# Patient Record
Sex: Female | Born: 1985
Health system: Southern US, Community
[De-identification: ages and names within clinical notes are randomized; demographics above are authoritative.]

## PROBLEM LIST (undated history)

## (undated) DIAGNOSIS — R519 Headache, unspecified: Secondary | ICD-10-CM

## (undated) DIAGNOSIS — R51 Headache: Secondary | ICD-10-CM

## (undated) DIAGNOSIS — N83209 Unspecified ovarian cyst, unspecified side: Secondary | ICD-10-CM

## (undated) DIAGNOSIS — R7303 Prediabetes: Secondary | ICD-10-CM

## (undated) DIAGNOSIS — F419 Anxiety disorder, unspecified: Secondary | ICD-10-CM

## (undated) HISTORY — DX: Prediabetes: R73.03

## (undated) HISTORY — DX: Anxiety disorder, unspecified: F41.9

## (undated) HISTORY — PX: WISDOM TOOTH EXTRACTION: SHX21

## (undated) HISTORY — PX: BREAST BIOPSY: SHX20

## (undated) HISTORY — DX: Unspecified ovarian cyst, unspecified side: N83.209

---

## 2006-04-12 ENCOUNTER — Emergency Department (HOSPITAL_COMMUNITY): Admission: EM | Admit: 2006-04-12 | Discharge: 2006-04-12 | Payer: Self-pay | Admitting: Emergency Medicine

## 2015-10-05 DIAGNOSIS — Z01419 Encounter for gynecological examination (general) (routine) without abnormal findings: Secondary | ICD-10-CM | POA: Diagnosis not present

## 2015-10-05 DIAGNOSIS — Z681 Body mass index (BMI) 19 or less, adult: Secondary | ICD-10-CM | POA: Diagnosis not present

## 2015-10-05 DIAGNOSIS — Z124 Encounter for screening for malignant neoplasm of cervix: Secondary | ICD-10-CM | POA: Diagnosis not present

## 2015-11-04 DIAGNOSIS — S0922XA Traumatic rupture of left ear drum, initial encounter: Secondary | ICD-10-CM | POA: Diagnosis not present

## 2015-12-06 DIAGNOSIS — Z0000 Encounter for general adult medical examination without abnormal findings: Secondary | ICD-10-CM | POA: Diagnosis not present

## 2015-12-08 DIAGNOSIS — Z681 Body mass index (BMI) 19 or less, adult: Secondary | ICD-10-CM | POA: Diagnosis not present

## 2015-12-08 DIAGNOSIS — Z0001 Encounter for general adult medical examination with abnormal findings: Secondary | ICD-10-CM | POA: Diagnosis not present

## 2015-12-08 DIAGNOSIS — D649 Anemia, unspecified: Secondary | ICD-10-CM | POA: Diagnosis not present

## 2016-07-25 DIAGNOSIS — L819 Disorder of pigmentation, unspecified: Secondary | ICD-10-CM | POA: Diagnosis not present

## 2016-07-25 DIAGNOSIS — D225 Melanocytic nevi of trunk: Secondary | ICD-10-CM | POA: Diagnosis not present

## 2016-07-25 DIAGNOSIS — D1801 Hemangioma of skin and subcutaneous tissue: Secondary | ICD-10-CM | POA: Diagnosis not present

## 2016-07-25 DIAGNOSIS — L700 Acne vulgaris: Secondary | ICD-10-CM | POA: Diagnosis not present

## 2016-09-02 DIAGNOSIS — Z6820 Body mass index (BMI) 20.0-20.9, adult: Secondary | ICD-10-CM | POA: Diagnosis not present

## 2016-09-02 DIAGNOSIS — J302 Other seasonal allergic rhinitis: Secondary | ICD-10-CM | POA: Diagnosis not present

## 2016-09-02 DIAGNOSIS — F419 Anxiety disorder, unspecified: Secondary | ICD-10-CM | POA: Diagnosis not present

## 2016-10-31 DIAGNOSIS — Z124 Encounter for screening for malignant neoplasm of cervix: Secondary | ICD-10-CM | POA: Diagnosis not present

## 2016-10-31 DIAGNOSIS — Z01419 Encounter for gynecological examination (general) (routine) without abnormal findings: Secondary | ICD-10-CM | POA: Diagnosis not present

## 2016-11-02 ENCOUNTER — Emergency Department (HOSPITAL_COMMUNITY): Payer: 59

## 2016-11-02 ENCOUNTER — Encounter (HOSPITAL_COMMUNITY): Payer: Self-pay | Admitting: Emergency Medicine

## 2016-11-02 ENCOUNTER — Emergency Department (HOSPITAL_COMMUNITY)
Admission: EM | Admit: 2016-11-02 | Discharge: 2016-11-02 | Disposition: A | Discharge status: Home / Self Care | Payer: 59 | Attending: Emergency Medicine | Admitting: Emergency Medicine

## 2016-11-02 DIAGNOSIS — R110 Nausea: Secondary | ICD-10-CM | POA: Diagnosis not present

## 2016-11-02 DIAGNOSIS — M549 Dorsalgia, unspecified: Secondary | ICD-10-CM | POA: Diagnosis not present

## 2016-11-02 DIAGNOSIS — N83201 Unspecified ovarian cyst, right side: Secondary | ICD-10-CM | POA: Diagnosis not present

## 2016-11-02 DIAGNOSIS — R1031 Right lower quadrant pain: Secondary | ICD-10-CM

## 2016-11-02 DIAGNOSIS — R109 Unspecified abdominal pain: Secondary | ICD-10-CM | POA: Diagnosis not present

## 2016-11-02 LAB — URINALYSIS, ROUTINE W REFLEX MICROSCOPIC
Bilirubin Urine: NEGATIVE
GLUCOSE, UA: NEGATIVE mg/dL
HGB URINE DIPSTICK: NEGATIVE
KETONES UR: 20 mg/dL — AB
Leukocytes, UA: NEGATIVE
Nitrite: NEGATIVE
PH: 6 (ref 5.0–8.0)
Protein, ur: 30 mg/dL — AB
Specific Gravity, Urine: 1.025 (ref 1.005–1.030)

## 2016-11-02 LAB — CBC
HCT: 39 % (ref 36.0–46.0)
Hemoglobin: 13.4 g/dL (ref 12.0–15.0)
MCH: 31.2 pg (ref 26.0–34.0)
MCHC: 34.4 g/dL (ref 30.0–36.0)
MCV: 90.9 fL (ref 78.0–100.0)
PLATELETS: 283 10*3/uL (ref 150–400)
RBC: 4.29 MIL/uL (ref 3.87–5.11)
RDW: 11.8 % (ref 11.5–15.5)
WBC: 5.3 10*3/uL (ref 4.0–10.5)

## 2016-11-02 LAB — COMPREHENSIVE METABOLIC PANEL
ALK PHOS: 32 U/L — AB (ref 38–126)
ALT: 13 U/L — AB (ref 14–54)
AST: 20 U/L (ref 15–41)
Albumin: 4.1 g/dL (ref 3.5–5.0)
Anion gap: 7 (ref 5–15)
BUN: 14 mg/dL (ref 6–20)
CALCIUM: 9.1 mg/dL (ref 8.9–10.3)
CO2: 27 mmol/L (ref 22–32)
CREATININE: 0.77 mg/dL (ref 0.44–1.00)
Chloride: 102 mmol/L (ref 101–111)
GFR calc non Af Amer: >60 mL/min (ref >60)
GLUCOSE: 97 mg/dL (ref 65–99)
Potassium: 3.8 mmol/L (ref 3.5–5.1)
SODIUM: 136 mmol/L (ref 135–145)
Total Bilirubin: 0.8 mg/dL (ref 0.3–1.2)
Total Protein: 6.8 g/dL (ref 6.5–8.1)

## 2016-11-02 LAB — LIPASE, BLOOD: Lipase: 27 U/L (ref 11–51)

## 2016-11-02 LAB — I-STAT BETA HCG BLOOD, ED (MC, WL, AP ONLY): I-stat hCG, quantitative: <5 m[IU]/mL (ref <5)

## 2016-11-02 MED ORDER — NAPROXEN 500 MG PO TABS: 500.0000 mg | ORAL_TABLET | Freq: Two times a day (BID) | ORAL | 0 refills | Status: DC | PRN

## 2016-11-02 MED ORDER — SODIUM CHLORIDE 0.9 % IV BOLUS (SEPSIS)
1000.0000 mL | Freq: Once | INTRAVENOUS | Status: AC
  Administered 2016-11-02: 1000 mL via INTRAVENOUS

## 2016-11-02 MED ORDER — MORPHINE SULFATE (PF) 4 MG/ML IV SOLN
4.0000 mg | Freq: Once | INTRAVENOUS | Status: AC
  Administered 2016-11-02: 4 mg via INTRAVENOUS
  Filled 2016-11-02: qty 1

## 2016-11-02 MED ORDER — HYDROCODONE-ACETAMINOPHEN 5-325 MG PO TABS: 1.0000 | ORAL_TABLET | Freq: Four times a day (QID) | ORAL | 0 refills | Status: DC | PRN

## 2016-11-02 MED ORDER — KETOROLAC TROMETHAMINE 30 MG/ML IJ SOLN
30.0000 mg | Freq: Once | INTRAMUSCULAR | Status: AC
  Administered 2016-11-02: 30 mg via INTRAVENOUS
  Filled 2016-11-02: qty 1

## 2016-11-02 MED ORDER — ONDANSETRON HCL 4 MG/2ML IJ SOLN
4.0000 mg | Freq: Once | INTRAMUSCULAR | Status: AC
  Administered 2016-11-02: 4 mg via INTRAVENOUS
  Filled 2016-11-02: qty 2

## 2016-11-02 MED ORDER — ONDANSETRON 4 MG PO TBDP: 4.0000 mg | ORAL_TABLET | Freq: Three times a day (TID) | ORAL | 0 refills | Status: DC | PRN

## 2016-11-02 NOTE — ED Triage Notes (Addendum)
Pt c/o onset Wednesday with right low back pain that increases with movement and after urination. Pt reports nausea with dry heaves onset today. Pt seen at OB/GYN Thursday and had urinalysis done but have not heard back about the results.

## 2016-11-02 NOTE — Discharge Instructions (Signed)
Your symptoms are due to two ovarian cysts. Alternate between naprosyn and norco as directed as needed for pain control. Do not drive or operate machinery while taking norco. Use heat to the areas of pain. You may get constipated from the norco, use over the counter stool softeners to help with this, and stay well hydrated with lots of water. Use zofran as directed as needed for nausea. Follow up with your OBGYN in the next 5-7 days for recheck of symptoms and ongoing management of this issue. If symptoms sudden change/worsen, then [facility] (similar to their version of an ER) for emergent evaluation.

## 2016-11-02 NOTE — ED Provider Notes (Signed)
[facility] EMERGENCY DEPARTMENT Provider Note   CSN: [id number] Arrival date & time: 11/02/16  3570     History   Chief Complaint Chief Complaint  Patient presents with  . Back Pain  . Nausea    HPI Kathleen Wood is a 31 y.o. Otherwise healthy female who works in the [employer] as a Marine scientist, who presents to the ED with complaints of gradual onset gradually worsening RLQ pain that radiates to the right flank that began 3 days ago. She describes the pain is 9/10 intermittent dull but occasionally sharp RLQ pain that radiates into the right flank area, worse with movement and urination, and unrelieved with Advil. She reports associated nausea. She states that she went her OB/GYN Dr. Harrington Challenger at [facility] on Thursday, the day after onset of symptoms, but at that time her pain wasn't as severe. They performed a pelvic exam in order to do an annual Pap smear, and patient reports that the pelvic exam did not really aggravate any of her pain. She had a UA done however she was never given any of the results therefore she assumes it was negative. She doesn't think a wet prep was performed, but she denies any vaginal discharge. LMP was 10/19/16. No history of kidney stones.  She denies fevers, chills, CP, SOB, vomiting, diarrhea/constipation, obstipation, melena, hematochezia, hematuria, dysuria, urine frequency/urgency, vaginal bleeding/discharge, myalgias, arthralgias, numbness, tingling, focal weakness, or any other complaints at this time. Denies recent travel, sick contacts, suspicious food intake, EtOH use, frequent NSAID use, or prior abd surgeries.    The history is provided by the patient and medical records. No language interpreter was used.  Abdominal Pain   This is a new problem. The current episode started more than 2 days ago. The problem occurs daily. The problem has been gradually worsening. The pain is associated with an unknown factor. The pain is located in the RLQ.  The quality of the pain is dull and sharp. The pain is at a severity of 9/10. The pain is moderate. Associated symptoms include nausea. Pertinent negatives include fever, diarrhea, flatus, hematochezia, melena, vomiting, constipation, dysuria, frequency, hematuria, arthralgias and myalgias. The symptoms are aggravated by activity and urination. Nothing relieves the symptoms.    History reviewed. No pertinent past medical history.  There are no active problems to display for this patient.   Past Surgical History:  Procedure Laterality Date  . WISDOM TOOTH EXTRACTION      OB History    No data available       Home Medications    Prior to Admission medications   Not on File    Family History No family history on file.  Social History Social History  Substance Use Topics  . Smoking status: Never Smoker  . Smokeless tobacco: Never Used  . Alcohol use Yes     Allergies   Patient has no known allergies.   Review of Systems Review of Systems  Constitutional: Negative for chills and fever.  Respiratory: Negative for shortness of breath.   Cardiovascular: Negative for chest pain.  Gastrointestinal: Positive for abdominal pain and nausea. Negative for blood in stool, constipation, diarrhea, flatus, hematochezia, melena and vomiting.  Genitourinary: Positive for flank pain. Negative for dysuria, frequency, hematuria, urgency, vaginal bleeding and vaginal discharge.  Musculoskeletal: Positive for back pain. Negative for arthralgias and myalgias.  Skin: Negative for color change.  Allergic/Immunologic: Negative for immunocompromised state.  Neurological: Negative for weakness and numbness.  Psychiatric/Behavioral: Negative for  confusion.   All other systems reviewed and are negative for acute change except as noted in the HPI.    Physical Exam Updated Vital Signs BP 117/83   Pulse (!) 105   Temp 98.4 F (36.9 C) (Oral)   Ht 5' (1.524 m)   Wt 49.9 kg (110 lb)   LMP  10/19/2016   SpO2 100%   BMI 21.48 kg/m   Physical Exam  Constitutional: She is oriented to person, place, and time. Vital signs are normal. She appears well-developed and well-nourished.  Non-toxic appearance. No distress.  Afebrile, nontoxic, appears uncomfortable but in NAD  HENT:  Head: Normocephalic and atraumatic.  Mouth/Throat: Oropharynx is clear and moist and mucous membranes are normal.  Eyes: Conjunctivae and EOM are normal. Right eye exhibits no discharge. Left eye exhibits no discharge.  Neck: Normal range of motion. Neck supple.  Cardiovascular: Regular rhythm, normal heart sounds and intact distal pulses.  Tachycardia present.  Exam reveals no gallop and no friction rub.   No murmur heard. Slightly tachycardic in low 100s, likely pain related  Pulmonary/Chest: Effort normal and breath sounds normal. No respiratory distress. She has no decreased breath sounds. She has no wheezes. She has no rhonchi. She has no rales.  Abdominal: Soft. Normal appearance and bowel sounds are normal. She exhibits no distension. There is tenderness in the right lower quadrant. There is CVA tenderness. There is no rigidity, no rebound, no guarding, no tenderness at McBurney's point and negative Murphy's sign.    Soft, nondistended, +BS throughout, with mild RLQ TTP tracking towards the R flank area, no r/g/r, neg murphy's, neg mcburney's point tenderness, +R sided CVA TTP   Musculoskeletal: Normal range of motion.  Neurological: She is alert and oriented to person, place, and time. She has normal strength. No sensory deficit.  Skin: Skin is warm, dry and intact. No rash noted.  Psychiatric: She has a normal mood and affect.  Nursing note and vitals reviewed.    ED Treatments / Results  Labs (all labs ordered are listed, but only abnormal results are displayed) Labs Reviewed  COMPREHENSIVE METABOLIC PANEL - Abnormal; Notable for the following:       Result Value   ALT 13 (*)    Alkaline  Phosphatase 32 (*)    All other components within normal limits  URINALYSIS, ROUTINE W REFLEX MICROSCOPIC - Abnormal; Notable for the following:    Ketones, ur 20 (*)    Protein, ur 30 (*)    Bacteria, UA RARE (*)    Squamous Epithelial / LPF 0-5 (*)    All other components within normal limits  LIPASE, BLOOD  CBC  I-STAT BETA HCG BLOOD, ED ([facility], [facility], [facility] ONLY)    EKG  EKG Interpretation None       Radiology US Pelvis Transvanginal Non-ob (tv Only)  Result Date: 11/02/2016 CLINICAL DATA:  Right lower quadrant tenderness for 3 days. Abnormal CT. EXAM: TRANSABDOMINAL AND TRANSVAGINAL ULTRASOUND OF PELVIS DOPPLER ULTRASOUND OF OVARIES TECHNIQUE: Both transabdominal and transvaginal ultrasound examinations of the pelvis were performed. Transabdominal technique was performed for global imaging of the pelvis including uterus, ovaries, adnexal regions, and pelvic cul-de-sac. It was necessary to proceed with endovaginal exam following the transabdominal exam to visualize the adnexal structures to an adequate degree. Color and duplex Doppler ultrasound was utilized to evaluate blood flow to the ovaries. COMPARISON:  CT from earlier same day. FINDINGS: Uterus Measurements: 6.7 x 3.6 x 5.2 cm. No fibroids or other mass  visualized. Endometrium Thickness: 9 mm. Subtle echogenic area within the fundal region of the endometrial complex measures 8 x 4 x 8 mm, of uncertain etiology or significance. No fluid within the underlying endometrial canal. Right ovary Measurements: 7.4 x 4.3 x 6.3 cm. Large right ovarian cyst measuring 6.3 x 3.9 cm, avascular. Additional moderate-sized right ovarian cyst measures 3 x 2.2 cm, avascular. No suspicious-appearing mass within the right ovary. No mass or significant free fluid in the adjacent right adnexal region. Left ovary Measurements: 3.9 x 1.9 x 1.6 cm. Numerous small follicles within the left ovary. No mass or free fluid in the adjacent left adnexal region. Pulsed  Doppler evaluation of both ovaries demonstrates normal low-resistance arterial and venous waveforms. Other findings Trace free fluid in the cul-de-sac and right adnexa is likely physiologic in nature. IMPRESSION: 1. Findings on recent CT correspond to a large right ovarian cyst and an additional moderate-sized right ovarian cyst, largest cyst measuring 6.3 x 3.9 cm. Right ovary is otherwise unremarkable. No evidence of associated ovarian torsion at this time. Please note that an ovarian cyst of this size may not resolve on its own and does predispose the patient to ovarian torsion in the future. At minimum, recommend follow-up pelvic ultrasound in 10-12 weeks to ensure resolution of the largest cyst. Consider gynecologic consultation for further workup and/or follow-up considerations. 2. Trace free fluid in the right adnexa and cul-de-sac is likely physiologic in nature. 3. Left ovary is normal. 4. Subtle echogenic focus within the fundal region of the endometrial complex, measuring 8 mm, of uncertain etiology or significance, suspect benign etiology but endometrial polyp or neoplasia cannot be confidently excluded based on this single exam. Remainder of the endometrium appears normal. Recommend attention to this area on the follow-up pelvic ultrasound in 10-12 weeks. Alternatively could consider more definitive characterization with uterine MRI. Electronically Signed   By: Franki Cabot M.D.   On: 11/02/2016 15:21   US Pelvis (transabdominal Only)  Result Date: 11/02/2016 CLINICAL DATA:  Right lower quadrant tenderness for 3 days. Abnormal CT. EXAM: TRANSABDOMINAL AND TRANSVAGINAL ULTRASOUND OF PELVIS DOPPLER ULTRASOUND OF OVARIES TECHNIQUE: Both transabdominal and transvaginal ultrasound examinations of the pelvis were performed. Transabdominal technique was performed for global imaging of the pelvis including uterus, ovaries, adnexal regions, and pelvic cul-de-sac. It was necessary to proceed with  endovaginal exam following the transabdominal exam to visualize the adnexal structures to an adequate degree. Color and duplex Doppler ultrasound was utilized to evaluate blood flow to the ovaries. COMPARISON:  CT from earlier same day. FINDINGS: Uterus Measurements: 6.7 x 3.6 x 5.2 cm. No fibroids or other mass visualized. Endometrium Thickness: 9 mm. Subtle echogenic area within the fundal region of the endometrial complex measures 8 x 4 x 8 mm, of uncertain etiology or significance. No fluid within the underlying endometrial canal. Right ovary Measurements: 7.4 x 4.3 x 6.3 cm. Large right ovarian cyst measuring 6.3 x 3.9 cm, avascular. Additional moderate-sized right ovarian cyst measures 3 x 2.2 cm, avascular. No suspicious-appearing mass within the right ovary. No mass or significant free fluid in the adjacent right adnexal region. Left ovary Measurements: 3.9 x 1.9 x 1.6 cm. Numerous small follicles within the left ovary. No mass or free fluid in the adjacent left adnexal region. Pulsed Doppler evaluation of both ovaries demonstrates normal low-resistance arterial and venous waveforms. Other findings Trace free fluid in the cul-de-sac and right adnexa is likely physiologic in nature. IMPRESSION: 1. Findings on recent CT correspond to  a large right ovarian cyst and an additional moderate-sized right ovarian cyst, largest cyst measuring 6.3 x 3.9 cm. Right ovary is otherwise unremarkable. No evidence of associated ovarian torsion at this time. Please note that an ovarian cyst of this size may not resolve on its own and does predispose the patient to ovarian torsion in the future. At minimum, recommend follow-up pelvic ultrasound in 10-12 weeks to ensure resolution of the largest cyst. Consider gynecologic consultation for further workup and/or follow-up considerations. 2. Trace free fluid in the right adnexa and cul-de-sac is likely physiologic in nature. 3. Left ovary is normal. 4. Subtle echogenic focus within  the fundal region of the endometrial complex, measuring 8 mm, of uncertain etiology or significance, suspect benign etiology but endometrial polyp or neoplasia cannot be confidently excluded based on this single exam. Remainder of the endometrium appears normal. Recommend attention to this area on the follow-up pelvic ultrasound in 10-12 weeks. Alternatively could consider more definitive characterization with uterine MRI. Electronically Signed   By: Franki Cabot M.D.   On: 11/02/2016 15:21   US Pelvic Doppler (torsion R/o Or Mass Arterial Flow)  Result Date: 11/02/2016 CLINICAL DATA:  Right lower quadrant tenderness for 3 days. Abnormal CT. EXAM: TRANSABDOMINAL AND TRANSVAGINAL ULTRASOUND OF PELVIS DOPPLER ULTRASOUND OF OVARIES TECHNIQUE: Both transabdominal and transvaginal ultrasound examinations of the pelvis were performed. Transabdominal technique was performed for global imaging of the pelvis including uterus, ovaries, adnexal regions, and pelvic cul-de-sac. It was necessary to proceed with endovaginal exam following the transabdominal exam to visualize the adnexal structures to an adequate degree. Color and duplex Doppler ultrasound was utilized to evaluate blood flow to the ovaries. COMPARISON:  CT from earlier same day. FINDINGS: Uterus Measurements: 6.7 x 3.6 x 5.2 cm. No fibroids or other mass visualized. Endometrium Thickness: 9 mm. Subtle echogenic area within the fundal region of the endometrial complex measures 8 x 4 x 8 mm, of uncertain etiology or significance. No fluid within the underlying endometrial canal. Right ovary Measurements: 7.4 x 4.3 x 6.3 cm. Large right ovarian cyst measuring 6.3 x 3.9 cm, avascular. Additional moderate-sized right ovarian cyst measures 3 x 2.2 cm, avascular. No suspicious-appearing mass within the right ovary. No mass or significant free fluid in the adjacent right adnexal region. Left ovary Measurements: 3.9 x 1.9 x 1.6 cm. Numerous small follicles within the  left ovary. No mass or free fluid in the adjacent left adnexal region. Pulsed Doppler evaluation of both ovaries demonstrates normal low-resistance arterial and venous waveforms. Other findings Trace free fluid in the cul-de-sac and right adnexa is likely physiologic in nature. IMPRESSION: 1. Findings on recent CT correspond to a large right ovarian cyst and an additional moderate-sized right ovarian cyst, largest cyst measuring 6.3 x 3.9 cm. Right ovary is otherwise unremarkable. No evidence of associated ovarian torsion at this time. Please note that an ovarian cyst of this size may not resolve on its own and does predispose the patient to ovarian torsion in the future. At minimum, recommend follow-up pelvic ultrasound in 10-12 weeks to ensure resolution of the largest cyst. Consider gynecologic consultation for further workup and/or follow-up considerations. 2. Trace free fluid in the right adnexa and cul-de-sac is likely physiologic in nature. 3. Left ovary is normal. 4. Subtle echogenic focus within the fundal region of the endometrial complex, measuring 8 mm, of uncertain etiology or significance, suspect benign etiology but endometrial polyp or neoplasia cannot be confidently excluded based on this single exam. Remainder of  the endometrium appears normal. Recommend attention to this area on the follow-up pelvic ultrasound in 10-12 weeks. Alternatively could consider more definitive characterization with uterine MRI. Electronically Signed   By: Franki Cabot M.D.   On: 11/02/2016 15:21   Ct Renal Stone Study  Result Date: 11/02/2016 CLINICAL DATA:  Right flank pain. EXAM: CT ABDOMEN AND PELVIS WITHOUT CONTRAST TECHNIQUE: Multidetector CT imaging of the abdomen and pelvis was performed following the standard protocol without IV contrast. COMPARISON:  None. FINDINGS: Lower chest: No acute abnormality. Hepatobiliary: No focal liver abnormality is seen. No gallstones, gallbladder wall thickening, or biliary  dilatation. Pancreas: Unremarkable. No pancreatic ductal dilatation or surrounding inflammatory changes. Spleen: Normal in size without focal abnormality. Adrenals/Urinary Tract: Adrenal glands are unremarkable. Kidneys are normal, without renal calculi, focal lesion, or hydronephrosis. Bladder is unremarkable. Stomach/Bowel: Stomach is within normal limits. Appendix appears normal. No evidence of bowel wall thickening, distention, or inflammatory changes. Moderate colonic stool burden. Vascular/Lymphatic: No significant vascular findings are present. No enlarged abdominal or pelvic lymph nodes. Reproductive: There is a 7.6 x 4.6 cm fluid density lesion in the right adnexa that appears to contain a few thin internal septations. The uterus and left adnexa are unremarkable. Other: No free fluid or pneumoperitoneum. Musculoskeletal: No acute or significant osseous findings. IMPRESSION: 1. 7.6 x 4.6 cm fluid density lesion with a few thin internal septations in the right adnexa, possibly representing an ovarian cyst or hydrosalpinx. Recommend further evaluation with pelvic ultrasound. 2. No renal or ureteral calculi.  No hydronephrosis. Electronically Signed   By: Titus Dubin M.D.   On: 11/02/2016 12:38    Procedures Procedures (including critical care time)  Medications Ordered in ED Medications  ondansetron (ZOFRAN) injection 4 mg (4 mg Intravenous Given 11/02/16 1158)  sodium chloride 0.9 % bolus 1,000 mL (0 mLs Intravenous Stopped 11/02/16 1334)  ketorolac (TORADOL) 30 MG/ML injection 30 mg (30 mg Intravenous Given 11/02/16 1158)  morphine 4 MG/ML injection 4 mg (4 mg Intravenous Given 11/02/16 1329)     Initial Impression / Assessment and Plan / ED Course  I have reviewed the triage vital signs and the nursing notes.  Pertinent labs & imaging results that were available during my care of the patient were reviewed by me and considered in my medical decision making (see chart for details).       30 y.o. female here with RLQ/R flank pain x3 days with associated nausea, went to OBGYN 2 days ago and had pelvic done which didn't really reproduce the pain; U/A done then but no results given to pt; pain progressively worsening. On exam, appears slightly uncomfortable, mildly tachycardic in the low 100s, with RLQ TTP tracking towards the R flank, +R CVA TTP, nonperitoneal, neg murphy's and mcburney's. Work up thus far reveals: betaHCG neg, CBC WNL, CMP WNL, lipase WNL. U/A pending. Will get CT renal to see if this is a kidney stone, also evaluate appendix although doubt this; could also be ovarian in etiology, if CT negative/shows adnexal finding then may consider pelvic U/S. Will give fluids and zofran, pt requesting toradol instead of any narcotics at this time; will reassess shortly  1:07 PM U/A with a few ketones/protein but no evidence of UTI. CT renal showing 7.6 x 4.6cm R adnexal lesion likely ovarian cyst or hydrosalpinx, will proceed with pelvic U/S. Pt feeling just slightly better with regards to pain, nausea improved; finally willing to take something more for pain, will give morphine then reassess after U/S.  4:06 PM Pelvic U/S confirms large ovarian cyst (actually, one large one and one smaller one) which are likely contributing to her pain; no ovarian torsion, but obviously due to the size, torsion is a concern. Given lack of acute/sudden change in symptoms, doubt that it's intermittently torsing at this point, however very strict return precautions advised regarding s/sx of this. Also showed endometrial lesion, recommended f/up for that as well. Given the size of the ovarian cyst, I discussed case with Dr. Rosana Hoes of [facility], and she felt that since there was no acute surgical pathology going on, pt could reasonable f/up outpatient for management of this, of course with the strict return precautions being stressed. Pt feeling much better after morphine, nausea continues to be  resolved. Will d/c home with naprosyn and norco, advised staying hydrated, will also rx zofran. Discussed use of heat as well. Very strict return precautions discussed, women's MAU evaluation discussed if symptoms worsen. F/up with OBGYN in 5-7 days for recheck of symptoms and ongoing management of these cysts.  [facility] reviewed prior to dispensing controlled substance medications, and 2 year search was notable for: alprazolam 0.25mg  #15 tabs on 09/09/16, otherwise no other controlled substances found. Risks/benefits/alternatives and expectations discussed regarding controlled substances. Side effects of medications discussed. Informed consent obtained.  I explained the diagnosis and have given explicit precautions to return to the ER including for any other new or worsening symptoms. The patient understands and accepts the medical plan as it's been dictated and I have answered their questions. Discharge instructions concerning home care and prescriptions have been given. The patient is STABLE and is discharged to home in good condition.    Final Clinical Impressions(s) / ED Diagnoses   Final diagnoses:  RLQ abdominal pain  Acute right lower quadrant pain  Cyst of right ovary  Nausea    New Prescriptions New Prescriptions   HYDROCODONE-ACETAMINOPHEN (NORCO) 5-325 MG TABLET    Take 1-2 tablets by mouth every 6 (six) hours as needed for severe pain.   NAPROXEN (NAPROSYN) 500 MG TABLET    Take 1 tablet (500 mg total) by mouth 2 (two) times daily as needed for mild pain, moderate pain or headache (TAKE WITH MEALS.).   ONDANSETRON (ZOFRAN ODT) 4 MG DISINTEGRATING TABLET    Take 1 tablet (4 mg total) by mouth every 8 (eight) hours as needed for nausea or vomiting.     [address], [place], [place] 22/[birth year] 8921    Delora Fuel, MD 19/41/74 [phone]

## 2016-11-04 ENCOUNTER — Inpatient Hospital Stay (HOSPITAL_COMMUNITY): Payer: 59

## 2016-11-04 ENCOUNTER — Observation Stay (HOSPITAL_COMMUNITY)
Admission: AD | Admit: 2016-11-04 | Discharge: 2016-11-05 | Disposition: A | Discharge status: Home / Self Care | Payer: 59 | Source: Ambulatory Visit | Attending: Obstetrics | Admitting: Obstetrics

## 2016-11-04 ENCOUNTER — Encounter (HOSPITAL_COMMUNITY): Payer: Self-pay | Admitting: *Deleted

## 2016-11-04 DIAGNOSIS — R112 Nausea with vomiting, unspecified: Secondary | ICD-10-CM | POA: Insufficient documentation

## 2016-11-04 DIAGNOSIS — N83291 Other ovarian cyst, right side: Secondary | ICD-10-CM | POA: Diagnosis not present

## 2016-11-04 DIAGNOSIS — M545 Low back pain: Secondary | ICD-10-CM | POA: Insufficient documentation

## 2016-11-04 DIAGNOSIS — R6883 Chills (without fever): Secondary | ICD-10-CM | POA: Diagnosis not present

## 2016-11-04 DIAGNOSIS — R1031 Right lower quadrant pain: Secondary | ICD-10-CM | POA: Diagnosis present

## 2016-11-04 DIAGNOSIS — Z79899 Other long term (current) drug therapy: Secondary | ICD-10-CM | POA: Diagnosis not present

## 2016-11-04 DIAGNOSIS — R102 Pelvic and perineal pain: Secondary | ICD-10-CM | POA: Diagnosis not present

## 2016-11-04 DIAGNOSIS — N83201 Unspecified ovarian cyst, right side: Secondary | ICD-10-CM | POA: Diagnosis present

## 2016-11-04 DIAGNOSIS — N83209 Unspecified ovarian cyst, unspecified side: Secondary | ICD-10-CM

## 2016-11-04 LAB — CBC
HEMATOCRIT: 33.4 % — AB (ref 36.0–46.0)
HEMOGLOBIN: 12.1 g/dL (ref 12.0–15.0)
MCH: 32.7 pg (ref 26.0–34.0)
MCHC: 36.2 g/dL — AB (ref 30.0–36.0)
MCV: 90.3 fL (ref 78.0–100.0)
Platelets: 260 10*3/uL (ref 150–400)
RBC: 3.7 MIL/uL — ABNORMAL LOW (ref 3.87–5.11)
RDW: 11.9 % (ref 11.5–15.5)
WBC: 6 10*3/uL (ref 4.0–10.5)

## 2016-11-04 LAB — URINALYSIS, ROUTINE W REFLEX MICROSCOPIC
Bilirubin Urine: NEGATIVE
GLUCOSE, UA: NEGATIVE mg/dL
HGB URINE DIPSTICK: NEGATIVE
Ketones, ur: 20 mg/dL — AB
LEUKOCYTES UA: NEGATIVE
Nitrite: NEGATIVE
PH: 5 (ref 5.0–8.0)
PROTEIN: NEGATIVE mg/dL
Specific Gravity, Urine: 1.015 (ref 1.005–1.030)

## 2016-11-04 MED ORDER — HYDROMORPHONE HCL 1 MG/ML IJ SOLN: 0.2000 mg | INTRAMUSCULAR | Status: DC | PRN

## 2016-11-04 MED ORDER — SIMETHICONE 80 MG PO CHEW: 80.0000 mg | CHEWABLE_TABLET | Freq: Four times a day (QID) | ORAL | Status: DC | PRN

## 2016-11-04 MED ORDER — ONDANSETRON HCL 4 MG PO TABS: 4.0000 mg | ORAL_TABLET | Freq: Four times a day (QID) | ORAL | Status: DC | PRN

## 2016-11-04 MED ORDER — SENNA 8.6 MG PO TABS
1.0000 | ORAL_TABLET | Freq: Two times a day (BID) | ORAL | Status: DC
  Administered 2016-11-04 – 2016-11-05 (×2): 8.6 mg via ORAL
  Filled 2016-11-04 (×3): qty 1

## 2016-11-04 MED ORDER — IBUPROFEN 600 MG PO TABS
600.0000 mg | ORAL_TABLET | Freq: Four times a day (QID) | ORAL | Status: DC | PRN
  Administered 2016-11-04 – 2016-11-05 (×2): 600 mg via ORAL
  Filled 2016-11-04 (×2): qty 1

## 2016-11-04 MED ORDER — OXYCODONE-ACETAMINOPHEN 5-325 MG PO TABS
1.0000 | ORAL_TABLET | Freq: Once | ORAL | Status: AC
  Administered 2016-11-04: 1 via ORAL
  Filled 2016-11-04: qty 1

## 2016-11-04 MED ORDER — ONDANSETRON HCL 4 MG/2ML IJ SOLN: 4.0000 mg | Freq: Four times a day (QID) | INTRAMUSCULAR | Status: DC | PRN

## 2016-11-04 MED ORDER — ACETAMINOPHEN 325 MG PO TABS: 650.0000 mg | ORAL_TABLET | Freq: Four times a day (QID) | ORAL | Status: DC | PRN

## 2016-11-04 MED ORDER — OXYCODONE HCL 5 MG PO TABS
5.0000 mg | ORAL_TABLET | ORAL | Status: DC | PRN
  Administered 2016-11-04: 5 mg via ORAL
  Administered 2016-11-05 (×2): 10 mg via ORAL
  Filled 2016-11-04: qty 1
  Filled 2016-11-04 (×2): qty 2

## 2016-11-04 NOTE — MAU Note (Signed)
Went to [facility] on Sat.  Dx with 2 large ovarian cysts on rt side.  Dc's with pain meds.  Tried to get in to gyn doc today, was unable.  When the pain is bad, it is really bad. Unable to lay down because of the pain in her back.

## 2016-11-04 NOTE — H&P (Signed)
31 y.o. G0 presents to MAU with pelvic pain.  She reports a 4 day history of pelvic pain.  Was seen in the ED on Saturday for RLQ vs flank pain.  Had a CT that showed normal appendix and no stone, but right adnexal mass.  TVUS showed right ovary with two simple cysts, measuring 6.9 x 3.9 cm and 3 x 2 cm with trace free fluid.   Exam was benign per note and she was discharged home with naproxen and norco.  She reports an increase intermittent pain overnight despite PO pain meds.  Continued back and RLQ pain.  Does not radiate.  Has decreased appetite but no vomiting.  Denies fevers or vaginal bleeding.  UPT neg 2d ago.  Repeat US was performed that showed largest cyst stable in size (measuring 5.2 x 4.9 cm).  The smaller cyst is now a hemorrhagic cyst measuring 4 x 3 cm with moderate free fluid in the pelvis.  Repeat CBC with stable hemoglobin of 12 and wbc 6.0.  She denies feeling dizzy or lightheaded with ambulation. Right ovary demonstrated normal low-resistance arterial and venous waveforms.   Of note, patient drinking frappuccino on presentation with no nausea or vomiting.  Does report no BM since Thursday and feeling bloated. CT scan on Saturday noted moderate colonic stool burden.      History reviewed. No pertinent past medical history.  Past Surgical History:  Procedure Laterality Date  . WISDOM TOOTH EXTRACTION      OB History  No data available    Social History   Social History  . Marital status: Single    Spouse name: N/A  . Number of children: N/A  . Years of education: N/A   Occupational History  . Not on file.   Social History Main Topics  . Smoking status: Never Smoker  . Smokeless tobacco: Never Used  . Alcohol use Yes  . Drug use: No  . Sexual activity: Not on file   Other Topics Concern  . Not on file   Social History Narrative  . No narrative on file   Patient has no known allergies.    Vitals:   11/04/16 1719 11/04/16 1720  BP:  112/75  Pulse:  93   Resp:    Temp:    SpO2: 99% 100%     General:  Patient resting comfortably in bed Abdomen:  Normal BS in all 4 quadrants.  Abdomen is soft, not rigid.  Mild TTP in RLQ.  No rebound or guarding.   Pelvic: cervix normal, no CMT.  Uterus soft, anteverted, mobile.  No left adnexal tenderness.  Mild right adnexal tenderness and fullness Ex:  no edema    A/P   31 y.o.  G0 with right hemorrhagic cyst and pelvic pain Long discussion with patient and family regards images on [place]. We discussed that ovarian torsion is a clinical diagnosis and flow to ovary noted on [place] does not definitively rule out torsion.  However, my suspicion for torsion is low at this time given overall exam and patient's clinical appearance.   There is moderate free fluid on ultrasound tonight, a change from prior scan--could represent simple fluid from cyst vs blood from hemorrhagic cyst. Either could be the cause of the pelvic pain due to ongoing peritoneal irritation.  Repeat hemoglobin tonight is stable and patient has stable vital signs with normal pulse and no signs/symptoms of acute blood loss anemia.  I do not feel strongly that diagnostic laparoscopy is indicated  at this time and the patient and her family agree.  Given second visit to ED in 3 days, will admit for observation overnight for pain control, serial exams, repeat cbc in AM.   Will keep NPO.     [place]

## 2016-11-04 NOTE — MAU Note (Signed)
Pt presents to MAU with increased pain from ovarian cyst diagnosed on 11/02/2016 in [facility] ED.  Pt is today reporting chills and increased N&V.

## 2016-11-04 NOTE — MAU Provider Note (Signed)
Patient Kathleen Wood is a 31 y.o. non-pregnant female here with abdominal pain. She was recently seen in the ED and diagnosed with ovarian cysts and given pain medication. She was instructed to follow up with her ob-gyn today but was not able to get an appt today. She came here because she was worried.   See ED note from 11-02-2016 for complete history and work-up.  History     CSN: [id number]  Arrival date and time: 11/04/16 1642   None     Chief Complaint  Patient presents with  . Abdominal Pain  . Back Pain   HPI  OB History    No data available      History reviewed. No pertinent past medical history.  Past Surgical History:  Procedure Laterality Date  . WISDOM TOOTH EXTRACTION      History reviewed. No pertinent family history.  Social History  Substance Use Topics  . Smoking status: Never Smoker  . Smokeless tobacco: Never Used  . Alcohol use Yes    Allergies: No Known Allergies  Prescriptions Prior to Admission  Medication Sig Dispense Refill Last Dose  . Adapalene 0.3 % gel Apply 1 application topically daily.   11/01/2016 at Unknown time  . clindamycin (CLEOCIN T) 1 % lotion Apply 1 application topically daily as needed.   11/01/2016 at Unknown time  . HYDROcodone-acetaminophen (NORCO) 5-325 MG tablet Take 1-2 tablets by mouth every 6 (six) hours as needed for severe pain. 20 tablet 0   . levocetirizine (XYZAL) 5 MG tablet Take 5 mg by mouth every evening.   11/01/2016 at Unknown time  . naproxen (NAPROSYN) 500 MG tablet Take 1 tablet (500 mg total) by mouth 2 (two) times daily as needed for mild pain, moderate pain or headache (TAKE WITH MEALS.). 20 tablet 0   . ondansetron (ZOFRAN ODT) 4 MG disintegrating tablet Take 1 tablet (4 mg total) by mouth every 8 (eight) hours as needed for nausea or vomiting. 15 tablet 0     Review of Systems  HENT: Negative.   Respiratory: Negative.   Cardiovascular: Negative.   Gastrointestinal: Positive for abdominal  pain. Negative for constipation and diarrhea.  Genitourinary: Negative.   Musculoskeletal: Negative.   Neurological: Negative.    Physical Exam   Blood pressure (!) 111/59, pulse 87, temperature 98.9 F (37.2 C), temperature source Oral, resp. rate 18, weight 109 lb 12 oz (49.8 kg), last menstrual period 10/19/2016, SpO2 99 %.  Physical Exam  Constitutional: She is oriented to person, place, and time. She appears well-developed.  HENT:  Head: Normocephalic.  Neck: Normal range of motion.  Respiratory: Effort normal.  GI: Soft.  Genitourinary:  Genitourinary Comments: NEFG; no lesions on outer labia. No suprapubic tenderness; tenderness to deep palpation over right ovary. Right lower quadrant feels enlarged and is tender to the touch. Left lower quadrant is non-tender and non-enlarged.   Musculoskeletal: Normal range of motion.  Neurological: She is alert and oriented to person, place, and time.  Skin: Skin is warm and dry.  Psychiatric: She has a normal mood and affect.    MAU Course  Procedures  MDM -percocet for pain x 23 -CBC and ABO -[place] and dopplers  Assessment and Plan  2030 pm: Dr. Loletta Specter at bedside to evaluate patient and assume care.   Mervyn Skeeters Donasia Wimes CNM 11/04/2016, 6:18 PM

## 2016-11-05 ENCOUNTER — Encounter (HOSPITAL_COMMUNITY): Payer: Self-pay

## 2016-11-05 DIAGNOSIS — N83291 Other ovarian cyst, right side: Secondary | ICD-10-CM | POA: Diagnosis not present

## 2016-11-05 DIAGNOSIS — M545 Low back pain: Secondary | ICD-10-CM | POA: Diagnosis not present

## 2016-11-05 DIAGNOSIS — R112 Nausea with vomiting, unspecified: Secondary | ICD-10-CM | POA: Diagnosis not present

## 2016-11-05 DIAGNOSIS — R6883 Chills (without fever): Secondary | ICD-10-CM | POA: Diagnosis not present

## 2016-11-05 DIAGNOSIS — Z79899 Other long term (current) drug therapy: Secondary | ICD-10-CM | POA: Diagnosis not present

## 2016-11-05 LAB — CBC
HCT: 31 % — ABNORMAL LOW (ref 36.0–46.0)
HEMATOCRIT: 31.5 % — AB (ref 36.0–46.0)
HEMOGLOBIN: 11.4 g/dL — AB (ref 12.0–15.0)
Hemoglobin: 11.3 g/dL — ABNORMAL LOW (ref 12.0–15.0)
MCH: 32.6 pg (ref 26.0–34.0)
MCH: 32.8 pg (ref 26.0–34.0)
MCHC: 36.5 g/dL — ABNORMAL HIGH (ref 30.0–36.0)
MCHC: 36.2 g/dL — ABNORMAL HIGH (ref 30.0–36.0)
MCV: 89.3 fL (ref 78.0–100.0)
MCV: 90.5 fL (ref 78.0–100.0)
PLATELETS: 250 10*3/uL (ref 150–400)
Platelets: 242 10*3/uL (ref 150–400)
RBC: 3.47 MIL/uL — AB (ref 3.87–5.11)
RBC: 3.48 MIL/uL — AB (ref 3.87–5.11)
RDW: 12 % (ref 11.5–15.5)
RDW: 12.1 % (ref 11.5–15.5)
WBC: 6.7 10*3/uL (ref 4.0–10.5)
WBC: 6.1 10*3/uL (ref 4.0–10.5)

## 2016-11-05 LAB — ABO/RH: ABO/RH(D): A NEG

## 2016-11-05 MED ORDER — OXYCODONE HCL 5 MG PO TABS: 5.0000 mg | ORAL_TABLET | ORAL | 0 refills | Status: DC | PRN

## 2016-11-05 NOTE — Progress Notes (Signed)
In to see and examine patient--she is currently sleeping.   Per RN, has been resting comfortably with reports of minimal pain.  Last examined by RN about 30 minutes ago with no concerns. Will not wake pt at this time.  Plan: repeat cbc at 0300.

## 2016-11-05 NOTE — Progress Notes (Signed)
Patient discharged home with family. Discharge teaching, paperwork, home care, prescriptions, and follow-up appts reviewed. No questions at this time.

## 2016-11-05 NOTE — Progress Notes (Signed)
Patient seen and examined.  Reports some continued lower abdominal pain, but overall improvement and dulling of pain from yesterday.  Back pain has improved significantly.  No further episodes of nausea, no vomiting.  No dizziness w ambulation to BR.  BP (!) 98/59 (BP Location: Left Arm)   Pulse 68   Temp 98.3 F (36.8 C) (Oral)   Resp 15   Ht 5' (1.524 m)   Wt 49.4 kg (109 lb)   LMP 10/19/2016   SpO2 100%   BMI 21.29 kg/m   Repeat CBC hgb 11.3 from 12.1  NAD Abd:  Soft, no rebound, no guarding.  Mild TTP RLQ  31yo w RLQ pain and hemorrhagic cyst Pain improving Slight drop in hgb, but BPs at baseline w pulse in 60s-70s.  No evidence of active bleeding--suspect drop in hemoglobin from initial rupture Long discussion w patient regarding symptoms and plan of care.  Do not feel urgent surgery warranted at this time for relatively stable hemoglobin as I doubt any active bleeding will be identified.  Recommend continued expectant management with follow up of unruptured 5 cm simple cyst on [place] in 6-12 weeks.  In interim, will manage pain with PO pain meds.  Will ambulate this AM.  If asymptomatic with ambulation, will consider allowing patient to eat with discharge to home this AM.  Patient agrees to plan of care.  Can consider short interval f/u in office at end of week with Dr. Harrington Challenger

## 2016-11-05 NOTE — Discharge Summary (Signed)
  31 y.o. G0 presented to MAU with pelvic pain.  She had 4 day history of pelvic pain.  Was seen in the ED on Saturday for RLQ vs flank pain.  Had a CT that showed normal appendix and no stone, but right adnexal mass.  TVUS showed right ovary with two simple cysts, measuring 6.9 x 3.9 cm and 3 x 2 cm with trace free fluid.   Exam was benign per note and she was discharged home with naproxen and norco.  She reports an increase intermittent pain overnight despite PO pain meds.  Continued back and RLQ pain.  Does not radiate.  Has decreased appetite but no vomiting.  Denies fevers or vaginal bleeding.  UPT neg 2d ago.  Repeat US was performed that showed largest cyst stable in size (measuring 5.2 x 4.9 cm).  The smaller cyst is now a hemorrhagic cyst measuring 4 x 3 cm with moderate free fluid in the pelvis.  Repeat CBC with stable hemoglobin of 12 and wbc 6.0.  She denies feeling dizzy or lightheaded with ambulation. Right ovary demonstrated normal low-resistance arterial and venous waveforms.   On hospital day 2, pt's pain is well controlled with PO pain meds. Hb declined from 12.1 to 11.3 initially.  At time of this discharge summary repeat CBC pending.  If stable pt will be discharged home with instruction to f/u in office Thurs with Dr. Carlis Abbott.  Rx for oxycodone given to patient by Dr. Carlis Abbott.  Precautions for hemorrhage and torsion reviewed with patient.

## 2016-11-07 DIAGNOSIS — N83201 Unspecified ovarian cyst, right side: Secondary | ICD-10-CM | POA: Diagnosis not present

## 2016-11-08 ENCOUNTER — Other Ambulatory Visit: Payer: Self-pay | Admitting: *Deleted

## 2016-11-08 NOTE — Patient Outreach (Signed)
[facility]) Care Management  11/08/2016  SALLYANNE BIRKHEAD 09-23-1985 [id number]   Subjective: Telephone call to patient's home number, no answer, left HIPAA compliant voicemail message, and requested call back.   Objective:  Per KPN (Knowledge Performance Now, point of care tool) and chart review, patient hospitalized  11/04/16 -11/05/16 for ovarian cysts.  Had ED visit on 11/02/16 for ovarian cysts and abdominal pain.     Assessment: Received UMR Transition of care referral on 11/07/16.   Transition of care follow up pending patient contact.     Plan: RNCM will call patient for 2nd telephone outreach attempt, transition of care follow up, within 10 business days if no return call.     Mical Kicklighter H. Annia Friendly, BSN, [place] Management [facility] Telephonic CM Phone: [phone] Fax: [phone]

## 2016-11-11 ENCOUNTER — Other Ambulatory Visit: Payer: Self-pay | Admitting: *Deleted

## 2016-11-11 ENCOUNTER — Ambulatory Visit: Payer: Self-pay | Admitting: *Deleted

## 2016-11-11 NOTE — Patient Outreach (Signed)
[facility]) Care Management  11/11/2016  Kathleen Wood 16-May-1985 [id number]   Subjective: Telephone call to patient's home number, no answer, left HIPAA compliant voicemail message, and requested call back.   Objective:  Per KPN (Knowledge Performance Now, point of care tool) and chart review, patient hospitalized  11/04/16 -11/05/16 for ovarian cysts.  Had ED visit on 11/02/16 for ovarian cysts and abdominal pain.     Assessment: Received UMR Transition of care referral on 11/07/16.   Transition of care follow up pending patient contact.     Plan: RNCM will call patient for 3rd telephone outreach attempt, transition of care follow up, within 10 business days if no return call.     Teylor Wolven H. Annia Friendly, BSN, [place] Management [facility] Telephonic CM Phone: [phone] Fax: [phone]

## 2016-11-12 ENCOUNTER — Ambulatory Visit: Payer: Self-pay | Admitting: *Deleted

## 2016-11-12 ENCOUNTER — Encounter: Payer: Self-pay | Admitting: *Deleted

## 2016-11-12 ENCOUNTER — Other Ambulatory Visit: Payer: Self-pay | Admitting: *Deleted

## 2016-11-12 NOTE — Patient Outreach (Signed)
Kathleen Wood) Care Management  11/12/2016  Kathleen Wood January 24, 1985 [id number]   Subjective: Telephone call to patient's mobile number, no answer, left HIPAA compliant voicemail message, and requested call back.   Objective:Per KPN (Knowledge Performance Now, point of care tool) and chart review,patient hospitalized 11/04/16 -11/05/16 for ovarian cysts. Had ED visit on 11/02/16 for ovarian cysts and abdominal pain.     Assessment: Received UMR Transition of care referral on 11/07/16.Transition of care follow up pending patient contact.    Plan:RNCM will send unsuccessful outreach  letter, [facility] pamphlet, and proceed with case closure, within 10 business days if no return call.    Kathleen Wood, BSN, [place] Management [facility] Telephonic CM Phone: [phone] Fax: [phone]

## 2016-11-26 ENCOUNTER — Other Ambulatory Visit: Payer: Self-pay | Admitting: *Deleted

## 2016-11-26 NOTE — Patient Outreach (Signed)
[facility]) [place] Management  11/26/2016  Kathleen Wood November 14, 1985 [id number]   No response from patient outreach attempts will proceed with case closure.     Objective:Per KPN (Knowledge Performance Now, point of Wood tool) and chart review,patient hospitalized 11/04/16 -11/05/16 for ovarian cysts. Had ED visit on 11/02/16 for ovarian cysts and abdominal pain.     Assessment: Received UMR Transition of Wood referral on 11/07/16.Transition of Wood follow up not completed due to unable to contact patient and will proceed with case closure.    Plan:RNCM will send case closure due to unable to reach request to Kathleen Wood at [place] Management.     Kathleen Wood, BSN, [place] Management [facility] Telephonic CM Phone: [phone] Fax: [phone]

## 2017-01-10 MED FILL — LEVOCETIRIZINE 5 MG TABLET: 5 | 30 days supply | Qty: 30 | Fill #0

## 2017-01-16 DIAGNOSIS — J0110 Acute frontal sinusitis, unspecified: Secondary | ICD-10-CM | POA: Diagnosis not present

## 2017-01-16 DIAGNOSIS — Z6821 Body mass index (BMI) 21.0-21.9, adult: Secondary | ICD-10-CM | POA: Diagnosis not present

## 2017-01-16 MED FILL — AMOX-CLAV 875-125 MG TABLET: 875-125 | 10 days supply | Qty: 20 | Fill #0

## 2017-02-18 MED FILL — LEVOCETIRIZINE 5 MG TABLET: 5 | 90 days supply | Qty: 90 | Fill #1

## 2017-05-19 MED FILL — oxyCODONE HCL 5 MG TABS: 5 | 5 days supply | Qty: 20 | Fill #0

## 2017-05-19 MED FILL — LEVOCETIRIZINE 5 MG TABLET: 5 | 30 days supply | Qty: 30 | Fill #2

## 2017-06-06 ENCOUNTER — Other Ambulatory Visit: Payer: Self-pay

## 2017-06-06 ENCOUNTER — Encounter (HOSPITAL_COMMUNITY)
Admission: RE | Admit: 2017-06-06 | Discharge: 2017-06-06 | Disposition: A | Discharge status: Home / Self Care | Payer: No Typology Code available for payment source | Source: Ambulatory Visit | Attending: Obstetrics | Admitting: Obstetrics

## 2017-06-06 ENCOUNTER — Encounter (HOSPITAL_COMMUNITY): Payer: Self-pay

## 2017-06-06 DIAGNOSIS — Z01812 Encounter for preprocedural laboratory examination: Secondary | ICD-10-CM | POA: Insufficient documentation

## 2017-06-06 HISTORY — DX: Headache: R51

## 2017-06-06 HISTORY — DX: Headache, unspecified: R51.9

## 2017-06-06 LAB — CBC
HCT: 34.8 % — ABNORMAL LOW (ref 36.0–46.0)
HEMOGLOBIN: 11.7 g/dL — AB (ref 12.0–15.0)
MCH: 31.2 pg (ref 26.0–34.0)
MCHC: 33.6 g/dL (ref 30.0–36.0)
MCV: 92.8 fL (ref 78.0–100.0)
Platelets: 269 10*3/uL (ref 150–400)
RBC: 3.75 MIL/uL — ABNORMAL LOW (ref 3.87–5.11)
RDW: 12.2 % (ref 11.5–15.5)
WBC: 6 10*3/uL (ref 4.0–10.5)

## 2017-06-06 LAB — TYPE AND SCREEN
ABO/RH(D): A NEG
Antibody Screen: NEGATIVE

## 2017-06-06 NOTE — Patient Instructions (Addendum)
Your procedure is scheduled on: Tuesday June 4 AT 12:00 pm  Enter through the Main Entrance of [facility] at:10:30 am  Pick up the phone at the desk and dial [phone].  Call this number if you have problems the morning of surgery: [phone].  Remember: Do NOT eat food: after Midnight on Monday 6/3 Do NOT drink clear liquids after: 6:00am Take these medicines the morning of surgery with a SIP OF WATER: Xyzal  STOP ALL VITAMINS, SUPPLEMENTS, HERBAL MEDICATIONS NOW  DO NOT SMOKE DAY OF SURGERY  Do NOT wear jewelry (body piercing), metal hair clips/bobby pins, make-up, or nail polish. Do NOT wear lotions, powders, or perfumes.  You may wear deoderant. Do NOT shave for 48 hours prior to surgery. Do NOT bring valuables to the hospital. Contacts, dentures, or bridgework may not be worn into surgery.  Have a responsible adult drive you home and stay with you for 24 hours after your procedure

## 2017-06-09 NOTE — Anesthesia Preprocedure Evaluation (Signed)
Anesthesia Evaluation  Patient identified by MRN, date of birth, ID band Patient awake    Reviewed: Allergy & Precautions, NPO status , Patient's Chart, lab work & pertinent test results  Airway Mallampati: II  TM Distance: >3 FB Neck ROM: Full    Dental no notable dental hx. (+) Teeth Intact, Dental Advisory Given   Pulmonary neg pulmonary ROS,    Pulmonary exam normal breath sounds clear to auscultation       Cardiovascular negative cardio ROS Normal cardiovascular exam Rhythm:Regular Rate:Normal     Neuro/Psych negative neurological ROS  negative psych ROS   GI/Hepatic negative GI ROS, Neg liver ROS,   Endo/Other  negative endocrine ROS  Renal/GU negative Renal ROS  negative genitourinary   Musculoskeletal negative musculoskeletal ROS (+)   Abdominal   Peds negative pediatric ROS (+)  Hematology negative hematology ROS (+)   Anesthesia Other Findings   Reproductive/Obstetrics negative OB ROS                             Lab Results  Component Value Date   WBC 6.0 06/06/2017   HGB 11.7 (L) 06/06/2017   HCT 34.8 (L) 06/06/2017   MCV 92.8 06/06/2017   PLT 269 06/06/2017    Anesthesia Physical Anesthesia Plan  ASA: I  Anesthesia Plan: General   Post-op Pain Management:    Induction: Intravenous  PONV Risk Score and Plan: 4 or greater and Treatment may vary due to age or medical condition, Ondansetron, Dexamethasone and Scopolamine patch - Pre-op  Airway Management Planned: Oral ETT  Additional Equipment:   Intra-op Plan:   Post-operative Plan: Extubation in OR  Informed Consent: I have reviewed the patients History and Physical, chart, labs and discussed the procedure including the risks, benefits and alternatives for the proposed anesthesia with the patient or authorized representative who has indicated his/her understanding and acceptance.   Dental advisory  given  Plan Discussed with: CRNA  Anesthesia Plan Comments:         Anesthesia Quick Evaluation

## 2017-06-10 ENCOUNTER — Encounter (HOSPITAL_COMMUNITY)
Admission: RE | Disposition: A | Discharge status: Home / Self Care | Payer: Self-pay | Source: Ambulatory Visit | Attending: Obstetrics

## 2017-06-10 ENCOUNTER — Encounter (HOSPITAL_COMMUNITY): Payer: Self-pay

## 2017-06-10 ENCOUNTER — Ambulatory Visit (HOSPITAL_COMMUNITY)
Admission: RE | Admit: 2017-06-10 | Discharge: 2017-06-10 | Disposition: A | Discharge status: Home / Self Care | Payer: No Typology Code available for payment source | Source: Ambulatory Visit | Attending: Obstetrics | Admitting: Obstetrics

## 2017-06-10 ENCOUNTER — Ambulatory Visit (HOSPITAL_COMMUNITY): Payer: No Typology Code available for payment source | Admitting: Anesthesiology

## 2017-06-10 ENCOUNTER — Other Ambulatory Visit: Payer: Self-pay

## 2017-06-10 DIAGNOSIS — N83201 Unspecified ovarian cyst, right side: Secondary | ICD-10-CM

## 2017-06-10 DIAGNOSIS — K668 Other specified disorders of peritoneum: Secondary | ICD-10-CM | POA: Insufficient documentation

## 2017-06-10 DIAGNOSIS — D270 Benign neoplasm of right ovary: Secondary | ICD-10-CM | POA: Insufficient documentation

## 2017-06-10 HISTORY — PX: LAPAROSCOPIC OVARIAN CYSTECTOMY: SHX6248

## 2017-06-10 LAB — PREGNANCY, URINE: Preg Test, Ur: NEGATIVE

## 2017-06-10 SURGERY — EXCISION, CYST, OVARY, LAPAROSCOPIC
Anesthesia: General | Site: Abdomen

## 2017-06-10 MED ORDER — SCOPOLAMINE 1 MG/3DAYS TD PT72
MEDICATED_PATCH | TRANSDERMAL | Status: AC
  Administered 2017-06-10: 1.5 mg via TRANSDERMAL
  Filled 2017-06-10: qty 1

## 2017-06-10 MED ORDER — MIDAZOLAM HCL 2 MG/2ML IJ SOLN
INTRAMUSCULAR | Status: AC
  Filled 2017-06-10: qty 2

## 2017-06-10 MED ORDER — BUPIVACAINE HCL (PF) 0.25 % IJ SOLN
INTRAMUSCULAR | Status: DC | PRN
  Administered 2017-06-10: 8 mL

## 2017-06-10 MED ORDER — SCOPOLAMINE 1 MG/3DAYS TD PT72
1.0000 | MEDICATED_PATCH | Freq: Once | TRANSDERMAL | Status: DC
  Administered 2017-06-10: 1.5 mg via TRANSDERMAL

## 2017-06-10 MED ORDER — ACETAMINOPHEN 10 MG/ML IV SOLN: 1000.0000 mg | Freq: Once | INTRAVENOUS | Status: DC | PRN

## 2017-06-10 MED ORDER — OXYCODONE HCL 5 MG PO TABS: 5.0000 mg | ORAL_TABLET | Freq: Four times a day (QID) | ORAL | 0 refills | Status: DC | PRN

## 2017-06-10 MED ORDER — ROCURONIUM BROMIDE 100 MG/10ML IV SOLN
INTRAVENOUS | Status: DC | PRN
  Administered 2017-06-10: 40 mg via INTRAVENOUS
  Administered 2017-06-10: 10 mg via INTRAVENOUS

## 2017-06-10 MED ORDER — LACTATED RINGERS IV SOLN
INTRAVENOUS | Status: DC
  Administered 2017-06-10: 125 mL/h via INTRAVENOUS
  Administered 2017-06-10 (×2): via INTRAVENOUS

## 2017-06-10 MED ORDER — GABAPENTIN 300 MG PO CAPS
300.0000 mg | ORAL_CAPSULE | Freq: Once | ORAL | Status: AC
  Administered 2017-06-10: 300 mg via ORAL

## 2017-06-10 MED ORDER — HYDROCODONE-ACETAMINOPHEN 7.5-325 MG PO TABS
1.0000 | ORAL_TABLET | Freq: Once | ORAL | Status: AC | PRN
  Administered 2017-06-10: 1 via ORAL

## 2017-06-10 MED ORDER — FENTANYL CITRATE (PF) 100 MCG/2ML IJ SOLN
INTRAMUSCULAR | Status: DC | PRN
  Administered 2017-06-10: 50 ug via INTRAVENOUS
  Administered 2017-06-10: 100 ug via INTRAVENOUS

## 2017-06-10 MED ORDER — PROMETHAZINE HCL 25 MG/ML IJ SOLN: 6.2500 mg | INTRAMUSCULAR | Status: DC | PRN

## 2017-06-10 MED ORDER — ACETAMINOPHEN 325 MG PO TABS
650.0000 mg | ORAL_TABLET | Freq: Once | ORAL | Status: AC
  Administered 2017-06-10: 650 mg via ORAL

## 2017-06-10 MED ORDER — ONDANSETRON HCL 4 MG/2ML IJ SOLN
INTRAMUSCULAR | Status: DC | PRN
  Administered 2017-06-10: 4 mg via INTRAVENOUS

## 2017-06-10 MED ORDER — PROPOFOL 10 MG/ML IV BOLUS
INTRAVENOUS | Status: DC | PRN
  Administered 2017-06-10: 180 mg via INTRAVENOUS

## 2017-06-10 MED ORDER — ONDANSETRON HCL 4 MG/2ML IJ SOLN
INTRAMUSCULAR | Status: AC
  Filled 2017-06-10: qty 2

## 2017-06-10 MED ORDER — CLONIDINE HCL 0.2 MG PO TABS
0.2000 mg | ORAL_TABLET | Freq: Once | ORAL | Status: AC
  Administered 2017-06-10: 0.2 mg via ORAL
  Filled 2017-06-10: qty 1

## 2017-06-10 MED ORDER — KETOROLAC TROMETHAMINE 30 MG/ML IJ SOLN
INTRAMUSCULAR | Status: DC | PRN
  Administered 2017-06-10: 30 mg via INTRAVENOUS

## 2017-06-10 MED ORDER — MEPERIDINE HCL 25 MG/ML IJ SOLN: 6.2500 mg | INTRAMUSCULAR | Status: DC | PRN

## 2017-06-10 MED ORDER — SUGAMMADEX SODIUM 200 MG/2ML IV SOLN
INTRAVENOUS | Status: DC | PRN
  Administered 2017-06-10: 200 mg via INTRAVENOUS

## 2017-06-10 MED ORDER — SUGAMMADEX SODIUM 200 MG/2ML IV SOLN
INTRAVENOUS | Status: AC
  Filled 2017-06-10: qty 2

## 2017-06-10 MED ORDER — DEXAMETHASONE SODIUM PHOSPHATE 10 MG/ML IJ SOLN
INTRAMUSCULAR | Status: DC | PRN
  Administered 2017-06-10 (×2): 10 mg via INTRAVENOUS

## 2017-06-10 MED ORDER — BUPIVACAINE HCL (PF) 0.25 % IJ SOLN
INTRAMUSCULAR | Status: AC
  Filled 2017-06-10: qty 30

## 2017-06-10 MED ORDER — LIDOCAINE HCL (CARDIAC) PF 100 MG/5ML IV SOSY
PREFILLED_SYRINGE | INTRAVENOUS | Status: DC | PRN
  Administered 2017-06-10: 80 mg via INTRAVENOUS

## 2017-06-10 MED ORDER — GABAPENTIN 300 MG PO CAPS
ORAL_CAPSULE | ORAL | Status: AC
  Administered 2017-06-10: 300 mg via ORAL
  Filled 2017-06-10: qty 1

## 2017-06-10 MED ORDER — FENTANYL CITRATE (PF) 250 MCG/5ML IJ SOLN
INTRAMUSCULAR | Status: AC
  Filled 2017-06-10: qty 5

## 2017-06-10 MED ORDER — IBUPROFEN 600 MG PO TABS: 600.0000 mg | ORAL_TABLET | Freq: Four times a day (QID) | ORAL | 0 refills | Status: DC | PRN

## 2017-06-10 MED ORDER — HYDROMORPHONE HCL 1 MG/ML IJ SOLN: 0.2500 mg | INTRAMUSCULAR | Status: DC | PRN

## 2017-06-10 MED ORDER — LIDOCAINE HCL (CARDIAC) PF 100 MG/5ML IV SOSY
PREFILLED_SYRINGE | INTRAVENOUS | Status: AC
  Filled 2017-06-10: qty 5

## 2017-06-10 MED ORDER — KETOROLAC TROMETHAMINE 30 MG/ML IJ SOLN
INTRAMUSCULAR | Status: AC
  Filled 2017-06-10: qty 1

## 2017-06-10 MED ORDER — HYDROCODONE-ACETAMINOPHEN 7.5-325 MG PO TABS
ORAL_TABLET | ORAL | Status: AC
  Administered 2017-06-10: 1 via ORAL
  Filled 2017-06-10: qty 1

## 2017-06-10 MED ORDER — ACETAMINOPHEN 325 MG PO TABS
ORAL_TABLET | ORAL | Status: AC
  Administered 2017-06-10: 650 mg via ORAL
  Filled 2017-06-10: qty 2

## 2017-06-10 MED ORDER — SODIUM CHLORIDE 0.9 % IR SOLN
Status: DC | PRN
  Administered 2017-06-10: 3000 mL

## 2017-06-10 MED ORDER — MIDAZOLAM HCL 2 MG/2ML IJ SOLN
INTRAMUSCULAR | Status: DC | PRN
  Administered 2017-06-10: 1 mg via INTRAVENOUS

## 2017-06-10 MED ORDER — PROPOFOL 10 MG/ML IV BOLUS
INTRAVENOUS | Status: AC
  Filled 2017-06-10: qty 20

## 2017-06-10 MED FILL — IBUPROFEN 600 MG TABLET: 600 | 10 days supply | Qty: 40 | Fill #0

## 2017-06-10 MED FILL — oxyCODONE HCL 5 MG TABS: 5 | 5 days supply | Qty: 20 | Fill #0

## 2017-06-10 SURGICAL SUPPLY — 30 items
ADH SKN CLS APL DERMABOND .7 (GAUZE/BANDAGES/DRESSINGS) ×1
APL SRG 38 LTWT LNG FL B (MISCELLANEOUS) ×1
APPLICATOR ARISTA FLEXITIP XL (MISCELLANEOUS) ×1 IMPLANT
BAG SPEC RTRVL LRG 6X4 10 (ENDOMECHANICALS)
DERMABOND ADVANCED (GAUZE/BANDAGES/DRESSINGS) ×1
DERMABOND ADVANCED .7 DNX12 (GAUZE/BANDAGES/DRESSINGS) ×1 IMPLANT
GLOVE BIOGEL PI IND STRL 6.5 (GLOVE) ×2 IMPLANT
GLOVE BIOGEL PI IND STRL 7.0 (GLOVE) ×2 IMPLANT
GLOVE BIOGEL PI INDICATOR 6.5 (GLOVE) ×2
GLOVE BIOGEL PI INDICATOR 7.0 (GLOVE) ×2
GLOVE ECLIPSE 6.0 STRL STRAW (GLOVE) ×2 IMPLANT
GOWN STRL REUS W/TWL LRG LVL3 (GOWN DISPOSABLE) ×6 IMPLANT
HEMOSTAT ARISTA ABSORB 3G PWDR (MISCELLANEOUS) ×1 IMPLANT
LIGASURE MARYLAND LAP STAND (ELECTROSURGICAL) ×1 IMPLANT
LIGASURE VESSEL 5MM BLUNT TIP (ELECTROSURGICAL) IMPLANT
NS IRRIG 1000ML POUR BTL (IV SOLUTION) ×2 IMPLANT
PACK LAPAROSCOPY BASIN (CUSTOM PROCEDURE TRAY) ×2 IMPLANT
PACK TRENDGUARD 450 HYBRID PRO (MISCELLANEOUS) IMPLANT
POUCH SPECIMEN RETRIEVAL 10MM (ENDOMECHANICALS) IMPLANT
PROTECTOR NERVE ULNAR (MISCELLANEOUS) ×4 IMPLANT
SET IRRIG TUBING LAPAROSCOPIC (IRRIGATION / IRRIGATOR) ×2 IMPLANT
SLEEVE XCEL OPT CAN 5 100 (ENDOMECHANICALS) ×2 IMPLANT
SUT VICRYL 0 UR6 27IN ABS (SUTURE) ×1 IMPLANT
SUT VICRYL RAPIDE 3 0 (SUTURE) ×2 IMPLANT
TOWEL OR 17X24 6PK STRL BLUE (TOWEL DISPOSABLE) ×4 IMPLANT
TRAY FOLEY W/BAG SLVR 14FR (SET/KITS/TRAYS/PACK) ×2 IMPLANT
TRENDGUARD 450 HYBRID PRO PACK (MISCELLANEOUS) ×2
TROCAR XCEL NON-BLD 11X100MML (ENDOMECHANICALS) ×2 IMPLANT
TROCAR XCEL NON-BLD 5MMX100MML (ENDOMECHANICALS) ×2 IMPLANT
WARMER LAPAROSCOPE (MISCELLANEOUS) ×2 IMPLANT

## 2017-06-10 NOTE — H&P (Signed)
32 y.o.  G0 presents for diagnostic laparoscopy and ovarian cystectomy for persistent ovarian mass.  She was seen initially in October 2018 for  for suspected hemorrhagic cyst.  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   At that time, she was asymptomatic and preferred expectant management.  She presented again this past month with intermittent right lower quadrant pain.  US showed persistent right ovarian cyst, mostly simple in appearance and essentially unchanged in size.  There are no septations or solid components.    Past Medical History:  Diagnosis Date  . Headache     Past Surgical History:  Procedure Laterality Date  . WISDOM TOOTH EXTRACTION      OB History  Gravida Para Term Preterm AB Living  0 0 0 0 0 0  SAB TAB Ectopic Multiple Live Births  0 0 0 0 0    Social History   Socioeconomic History  . Marital status: Single    Spouse name: Not on file  . Number of children: Not on file  . Years of education: Not on file  . Highest education level: Not on file  Occupational History  . Not on file  Tobacco Use  . Smoking status: Never Smoker  . Smokeless tobacco: Never Used  Substance and Sexual Activity  . Alcohol use: Yes    Alcohol/week: 0.6 oz    Types: 1 Glasses of wine per week    Comment: occasional  . Drug use: No  . Sexual activity: Never     Patient has no known allergies.    Vitals:   06/10/17 1103  BP: 110/79  Pulse: 82  Resp: 16  Temp: 98.6 F (37 C)  SpO2: 100%     General:  NAD Lungs: CTAB Cardiac: RRR Abdomen:  Soft Ex:  no edema  UPT neg   A/P   32 y.o. G0 with symptomatic right ovarian cyst for diagnostic laparoscopy and ovarian cystectomy.  Discussed in detail risk, benefits, alternatives to procedures.  Discussed risks of surgery to include, but not limited to: infection, bleeding, damage to surrounding structures  (for example, bowel, bladder, ureter, ovary, nerves, vessels), inability to preserve or identify healthy residual ovarian tissue requiring oophorectomy instead of cystectomy, vte/pe/stroke, anesthesia complications, conversion to laparotomy, need for blood transfusion. We discussed that removal of this cyst does not prevent future recurrence, either on right or contralateral ovary.  UPT negative.  All questions answered and consent signed.   [place]

## 2017-06-10 NOTE — Anesthesia Postprocedure Evaluation (Signed)
Anesthesia Post Note  Patient: SHIESHA JAHN  Procedure(s) Performed: LAPAROSCOPIC RIGHT OVARIAN CYSTECTOMY WITH PERITONEAL BIOPSY (N/A Abdomen)     Patient location during evaluation: PACU Anesthesia Type: General Level of consciousness: awake and alert Pain management: pain level controlled Vital Signs Assessment: post-procedure vital signs reviewed and stable Respiratory status: spontaneous breathing, nonlabored ventilation, respiratory function stable and patient connected to nasal cannula oxygen Cardiovascular status: blood pressure returned to baseline and stable Postop Assessment: no apparent nausea or vomiting Anesthetic complications: no    Last Vitals:  Vitals:   06/10/17 1605 06/10/17 1620  BP: (!) 78/41 (!) 77/43  Pulse: 62 70  Resp: 16 16  Temp: 36.6 C 36.6 C  SpO2: 100% 100%    Last Pain:  Vitals:   06/10/17 1620  TempSrc: Oral  PainSc: 1    Pain Goal: Patients Stated Pain Goal: 3 (06/10/17 1620)               [place]

## 2017-06-10 NOTE — Anesthesia Procedure Notes (Signed)
Procedure Name: Intubation Date/Time: 06/10/2017 12:05 PM Performed by: Asher Muir, CRNA Pre-anesthesia Checklist: Patient identified, Emergency Drugs available, Suction available and Patient being monitored Patient Re-evaluated:Patient Re-evaluated prior to induction Oxygen Delivery Method: Circle system utilized and Simple face mask Preoxygenation: Pre-oxygenation with 100% oxygen Induction Type: IV induction Ventilation: Mask ventilation without difficulty Laryngoscope Size: Mac and 3 Grade View: Grade II Tube type: Oral Tube size: 7.0 mm Number of attempts: 1 Airway Equipment and Method: Stylet Placement Confirmation: ETT inserted through vocal cords under direct vision,  positive ETCO2 and breath sounds checked- equal and bilateral Secured at: 20 (right lip) cm Tube secured with: Tape Dental Injury: Teeth and Oropharynx as per pre-operative assessment

## 2017-06-10 NOTE — Transfer of Care (Signed)
Immediate Anesthesia Transfer of Care Note  Patient: Kathleen Wood  Procedure(s) Performed: LAPAROSCOPIC RIGHT OVARIAN CYSTECTOMY WITH PERITONEAL BIOPSY (N/A Abdomen)  Patient Location: PACU  Anesthesia Type:General  Level of Consciousness: sedated  Airway & Oxygen Therapy: Patient Spontanous Breathing  Post-op Assessment: Report given to RN  Post vital signs: Reviewed and stable  Last Vitals:  Vitals Value Taken Time  BP    Temp    Pulse 70 06/10/2017  1:38 PM  Resp 17 06/10/2017  1:38 PM  SpO2 100 % 06/10/2017  1:38 PM  Vitals shown include unvalidated device data.  Last Pain:  Vitals:   06/10/17 1103  TempSrc: Oral  PainSc: 0-No pain      Patients Stated Pain Goal: 3 (92/76/39 4320)  Complications: No apparent anesthesia complications

## 2017-06-10 NOTE — Progress Notes (Signed)
Dr Valma Cava informed of patient's bp being 78/41.  Ok to discharge home.  Patient stood up from recliner, no dizziness, no nausea.  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  Patient was w/c out to main lobby.  Patient going home with mother via car.

## 2017-06-10 NOTE — Brief Op Note (Signed)
06/10/2017  1:25 PM  PATIENT:  Kathleen Wood  32 y.o. female  PRE-OPERATIVE DIAGNOSIS:  OVARIAN CYST  POST-OPERATIVE DIAGNOSIS:  ovarian cyst  PROCEDURE:  Procedure(s): LAPAROSCOPIC RIGHT OVARIAN CYSTECTOMY WITH PERITONEAL BIOPSY (N/A)  SURGEON:  Surgeon(s) and Role:    Jerelyn Charles, MD - Primary    * Bobbye Charleston, MD - Assisting  ANESTHESIA:   general  EBL:  50 mL   BLOOD ADMINISTERED:none  DRAINS: none   LOCAL MEDICATIONS USED:  MARCAINE     SPECIMEN:  Source of Specimen:  peritoneal biopsy, right ovarian cyst  DISPOSITION OF SPECIMEN:  PATHOLOGY  COUNTS:  YES  TOURNIQUET:  * No tourniquets in log *  DICTATION: .Note written in EPIC  PLAN OF CARE: Discharge to home after PACU  PATIENT DISPOSITION:  PACU - hemodynamically stable.   Delay start of Pharmacological VTE agent (>24hrs) due to surgical blood loss or risk of bleeding: not applicable

## 2017-06-10 NOTE — Op Note (Signed)
PATIENT:  Kathleen Wood  32 y.o. female  PRE-OPERATIVE DIAGNOSIS:  OVARIAN CYST  POST-OPERATIVE DIAGNOSIS:  ovarian cyst  PROCEDURE:  Procedure(s): LAPAROSCOPIC RIGHT OVARIAN CYSTECTOMY WITH PERITONEAL BIOPSY (N/A)  SURGEON:  Surgeon(s) and Role:    Jerelyn Charles, MD - Primary    Bobbye Charleston, MD - Assisting  ANESTHESIA:   general  EBL:  50 mL   LOCAL MEDICATIONS USED:  10 mL of 0.25 % MARCAINE     SPECIMEN:  Source of Specimen:  peritoneal biopsy, right ovarian cyst  DISPOSITION OF SPECIMEN:  PATHOLOGY  OPERATIVE FINDINGS:  Normal uterus.  Normal left ovary and tube.  Enlarged right ovary and tube torsed three times.  On right pelvic sidewall and posterior cul de sac, few powder burn lesions consistent with endometriosis.    PROCEDURE:   Following the appropriate informed consent the patient was taken to the operating room. She was placed in dorsal lithotomy position, and general anesthesia was administered. The abdomen, perineum, and vagina, were prepped in the normal sterile fashion.  The bladder was drained with a foley catheter.  A speculum was placed in the vagina and an acorn uterine manipulator was placed transcervically. The speculum was removed from the vagina. Attention was then turned to the abdominal portion of the case. Following the appropriate draping, 0.25% Marcaine was injected infraumbilically. An incision was made at the base of the umbilicus with a scalpel. The 10/11 mm Optiview trocar was used to enter the abdomen under direct visualization. Entry was confirmed and the abdomen was insufflated with  CO2 gas.  The intra-abdominal cavity was inspected and findings were as above. The patient was placed in Trendelenburg. Two additional 5 mm ports were placed bilaterally and the right and left lower pelvis under direct visualization.  The monopolar scissors were used to excise an area of suspect endometriosis on the right pelvic side wall.   The uterus was  elevated and the right ovary was de-torsed.  The cyst was identified and the monopolar scissors were used to open the cyst capsule.  The correct plane was identified and the cyst was shelled out of the overlying capsule.  During removal, the cyst ruptured with copious straw colored clear fluid.  The cyst was detached from normal ovary.  The remaining cyst wall and capsule were detached from normal ovarian tissue using the LigaSure.  Hemostasis was obtained at the cyst wall bed with bipolar cautery.  The endometriosis in the posterior cul de sac was not ablated due to proximity to the ureter.  The pelvis was copiously irrigated.  The cyst was removed and sent to pathology.  The remaining ovary was hemostatic and roughly equal in size to the left ovary.  A coating of Arista was applied.  The abdomen was desufflated, the ports were removed, and the fascia at the umbilicus was closed with 0-Vicryl in with a figure of eight stitch.  The skin was closed in a subcuticular fashion with 4-0 vicryl at the umbilicus.  Dermabond was applied to all skin incisions.  The uterine manipulator was removed from the vagina. All sponge lap needle counts were correct x2.  The patient tolerated the procedure well and was brought to the recovery room in stable condition

## 2017-06-10 NOTE — Discharge Instructions (Addendum)
DISCHARGE INSTRUCTIONS: Laparoscopic ovarian cystectomy/ peritoneal biopsy The following instructions have been prepared to help you care for yourself upon your return home today.  **You may begin taking Ibuprofen after 4:47 pm today**  Wound care:  Do not get the incision wet for the first 24 hours. The incision should be kept clean and dry.  The dressing on your umbilicus may be removed the second day after surgery (Thursday).  Should the incision become sore, red, and swollen after the first week, check with your doctor. **No vaginal entry including sexual intercourse for 2 weeks**  Personal hygiene:  Shower the day after your procedure.  Activity and limitations:  Do NOT drive or operate any equipment today or while taking narcotics.  Do NOT lift anything more than 15 pounds for 2-3 weeks after surgery.  Do NOT rest in bed all day.  Walking is encouraged. Walk each day, starting slowly with 5-minute walks 3 or 4 times a day. Slowly increase the length of your walks.  Walk up and down stairs slowly.  Do NOT do strenuous activities, such as golfing, playing tennis, bowling, running, biking, weight lifting, gardening, mowing, or vacuuming for 2-4 weeks. Ask your doctor when it is okay to start.  Diet: Eat a light meal as desired this evening. You may resume your usual diet tomorrow.  Return to work: This is dependent on the type of work you do. For the most part you can return to a desk job within a week of surgery. If you are more active at work, please discuss this with your doctor.  What to expect after your surgery: You may have a slight burning sensation when you urinate on the first day. You may have a very small amount of blood in the urine. Expect to have a small amount of vaginal discharge/light bleeding for 1-2 weeks. It is not unusual to have abdominal soreness and bruising for up to 2 weeks. You may be tired and need more rest for about 1 week. You may experience  shoulder pain for 24-72 hours. Lying flat in bed may relieve it.  Call your doctor for any of the following:  Develop a fever of 100.4 or greater  Inability to urinate 6 hours after discharge from hospital  Severe pain not relieved by pain medications  Persistent of heavy bleeding at incision site  Redness or swelling around incision site after a week  Increasing nausea or vomiting  Patient Signature________________________________________  Nurse Signature_________________________________________  Support person's signature________________________________ Pelvic rest x 2 weeks (no intercourse or tampons).  No tub baths or swimming for two weeks.    Do not drive until you are not taking narcotic pain medication.   Call your doctor if you have heavy vaginal bleeding (soaking through a pad an hour or more for >2 hours in a row), temperature >101F, severe nausea, vomiting, severe or worsening abdominal pain, dizziness, shortness of breath, chest pain or any other concerns.  Please take both motrin and tylenol every 6 hours.  Add oxycodone if you have severe break through pain not controlled with motrin or tylenol.

## 2017-06-11 ENCOUNTER — Encounter (HOSPITAL_COMMUNITY): Payer: Self-pay | Admitting: Obstetrics

## 2017-06-11 NOTE — Progress Notes (Signed)
Post op call June 5,2019, 1255, patient stated small numb area notied on outer right leg above knee, states no pain or no problem walking, instructed to continue to observe, if doesn't improve or worsens consult with her doctor.

## 2017-07-02 MED FILL — LEVOCETIRIZINE 5 MG TABLET: 5 | 30 days supply | Qty: 30 | Fill #0

## 2017-08-11 MED FILL — LEVOCETIRIZINE 5 MG TABLET: 5 | 60 days supply | Qty: 60 | Fill #1

## 2017-09-22 ENCOUNTER — Encounter: Payer: Self-pay | Admitting: Neurology

## 2017-09-22 ENCOUNTER — Encounter

## 2017-09-22 ENCOUNTER — Other Ambulatory Visit: Payer: Self-pay

## 2017-09-22 ENCOUNTER — Ambulatory Visit: Payer: No Typology Code available for payment source | Admitting: Neurology

## 2017-09-22 VITALS — BP 103/68 | HR 77 | Resp 14 | Ht 60.0 in | Wt 110.0 lb

## 2017-09-22 DIAGNOSIS — G2581 Restless legs syndrome: Secondary | ICD-10-CM

## 2017-09-22 DIAGNOSIS — G5711 Meralgia paresthetica, right lower limb: Secondary | ICD-10-CM | POA: Diagnosis not present

## 2017-09-22 MED ORDER — GABAPENTIN 300 MG PO CAPS: 300.0000 mg | ORAL_CAPSULE | Freq: Three times a day (TID) | ORAL | 11 refills | Status: DC

## 2017-09-22 MED ORDER — LIDOCAINE 5 % EX PTCH: 1.0000 | MEDICATED_PATCH | CUTANEOUS | 11 refills | Status: DC

## 2017-09-22 MED FILL — GABAPENTIN 300 MG CAPSULE: 300 | 30 days supply | Qty: 90 | Fill #0

## 2017-09-22 MED FILL — LIDOCAINE PATCH 5%: 5 | 30 days supply | Qty: 30 | Fill #0

## 2017-09-22 NOTE — Progress Notes (Signed)
[facility]    Provider:  Dr Jaynee Eagles Referring Provider: Reubin Milan MD Primary Care Physician:  Celene Squibb, MD  CC:  Nerve damage after lapascopic surgery  HPI:  Kathleen Wood is a 32 y.o. female here as requested by Dr. Nevada Crane for . PMHx lapascopic ovarian cyst 2019.  Surgery was in June 2019, noticed the numbness right afterwards. Initially numbness and then tingling. Not pain but a lightning strike sometimes. Continuously tingling not painful. Sometimes she doesn't notice it at all. She has electrical painful shocks and catches her and if things brush up agaist it very uncomfortable. Sensation is improving. No weakness no rashes or lesions. No other focal neurologic deficits, associated symptoms, inciting events or modifiable factors.  Reviewed notes, labs and imaging from outside physicians, which showed:  Reviewed notes from Jerelyn Charles MD referring physician. Patient is s/p r ovary cystectomy, notes increased sensation on anterior and lateral thigh since awakening from surgery. No weakness. Surgery time was approx 1 hour in lithotomy and no deep [e;vic dissection; decreased sensation right lateral thigh - anterior/lateral compartment extending to knee. 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   CBC with mild anemia 11.7/34.8  Review of Systems: Patient complains of symptoms per HPI as well as the following symptoms numbness, anxiety allergies. Pertinent negatives and positives per HPI. All others negative.   Social History   Socioeconomic History  . Marital status: Single    Spouse name: Not on file  . Number of children: Not on file  . Years of education: Not on file  . Highest education level: Not on file  Occupational History  . Not on file  Social Needs  . Financial resource strain: Not on file  . Food insecurity:    Worry: Not on file    Inability: Not on file  . Transportation needs:    Medical: Not on file    Non-medical: Not on file    Tobacco Use  . Smoking status: Never Smoker  . Smokeless tobacco: Never Used  Substance and Sexual Activity  . Alcohol use: Yes    Alcohol/week: 1.0 standard drinks    Types: 1 Glasses of wine per week    Comment: occasional  . Drug use: No  . Sexual activity: Never  Lifestyle  . Physical activity:    Days per week: Not on file    Minutes per session: Not on file  . Stress: Not on file  Relationships  . Social connections:    Talks on phone: Not on file    Gets together: Not on file    Attends religious service: Not on file    Active member of club or organization: Not on file    Attends meetings of clubs or organizations: Not on file    Relationship status: Not on file  . Intimate partner violence:    Fear of current or ex partner: Not on file    Emotionally abused: Not on file    Physically abused: Not on file    Forced sexual activity: Not on file  Other Topics Concern  . Not on file  Social History Narrative  . Not on file    Family History  Problem Relation Age of Onset  . Diabetes type II Father   . Healthy Brother     Past Medical History:  Diagnosis Date  . Headache     Past Surgical History:  Procedure Laterality Date  . LAPAROSCOPIC OVARIAN CYSTECTOMY N/A 06/10/2017   Procedure:  LAPAROSCOPIC RIGHT OVARIAN CYSTECTOMY WITH PERITONEAL BIOPSY;  Surgeon: Jerelyn Charles, MD;  Location: [facility];  Service: Gynecology;  Laterality: N/A;  . WISDOM TOOTH EXTRACTION      Current Outpatient Medications  Medication Sig Dispense Refill  . Adapalene 0.3 % gel Apply 1 application topically daily as needed (acne).     . clindamycin (CLEOCIN T) 1 % lotion Apply 1 application topically daily as needed (acne).     Marland Kitchen ibuprofen (ADVIL,MOTRIN) 200 MG tablet Take 200 mg by mouth every 6 (six) hours as needed.    Marland Kitchen levocetirizine (XYZAL) 5 MG tablet Take 5 mg by mouth daily as needed for allergies.     . Melatonin 5 MG TABS Take 5 mg by mouth at bedtime as needed (sleep).     . vitamin C (ASCORBIC ACID) 500 MG tablet Take 500 mg by mouth daily as needed (immune support).    . gabapentin (NEURONTIN) 300 MG capsule Take 1 capsule (300 mg total) by mouth 3 (three) times daily. 90 capsule 11  . lidocaine (LIDODERM) 5 % Place 1 patch onto the skin daily. Remove & Discard patch within 12 hours or as directed by MD 30 patch 11   No current facility-administered medications for this visit.     Allergies as of 09/22/2017  . (No Known Allergies)    Vitals: BP 103/68   Pulse 77   Resp 14   Ht 5' (1.524 m)   Wt 110 lb (49.9 kg)   BMI 21.48 kg/m  Last Weight:  Wt Readings from Last 1 Encounters:  09/22/17 110 lb (49.9 kg)   Last Height:   Ht Readings from Last 1 Encounters:  09/22/17 5' (1.524 m)   Physical exam: Exam: Gen: NAD, conversant, well nourised, well groomed                     CV: RRR, no MRG. No Carotid Bruits. No peripheral edema, warm, nontender Eyes: Conjunctivae clear without exudates or hemorrhage  Neuro: Detailed Neurologic Exam  Speech:    Speech is normal; fluent and spontaneous with normal comprehension.  Cognition:    The patient is oriented to person, place, and time;     recent and remote memory intact;     language fluent;     normal attention, concentration,     fund of knowledge Cranial Nerves:    The pupils are equal, round, and reactive to light. The fundi are normal and spontaneous venous pulsations are present. Visual fields are full to finger confrontation. Extraocular movements are intact. Trigeminal sensation is intact and the muscles of mastication are normal. The face is symmetric. The palate elevates in the midline. Hearing intact. Voice is normal. Shoulder shrug is normal. The tongue has normal motion without fasciculations.   Coordination:    Normal finger to nose and heel to shin. Normal rapid alternating movements.   Gait:    Heel-toe and tandem gait are normal.   Motor Observation:    No asymmetry, no  atrophy, and no involuntary movements noted. Tone:    Normal muscle tone.    Posture:    Posture is normal. normal erect    Strength:    Strength is V/V in the upper and lower limbs.      Sensation: decreased right lateral thigh, hyperesthesiasa and allodynia in lateral femoral cutaneous nerve distribution     Reflex Exam:  DTR's:    Deep tendon reflexes in the upper and lower extremities are  normal bilaterally.   Toes:    The toes are downgoing bilaterally.   Clonus:    Clonus is absent.     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  - tincture of time - stretching exercises, gave documentation - gabapentin prn for neuralgia or RLS - topical OTC - discussed iron supplementation for RLS in case ferritin is low - Lateral femoral nerve blocks  Meds ordered this encounter  Medications  . gabapentin (NEURONTIN) 300 MG capsule    Sig: Take 1 capsule (300 mg total) by mouth 3 (three) times daily.    Dispense:  90 capsule    Refill:  11  . lidocaine (LIDODERM) 5 %    Sig: Place 1 patch onto the skin daily. Remove & Discard patch within 12 hours or as directed by MD    Dispense:  30 patch    Refill:  Logan, MD  [facility] [address] [place], [place] [address]  Phone [phone] Fax [phone]

## 2017-09-22 NOTE — Patient Instructions (Addendum)
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  - tincture of time - stretching exercises, gave documentation - gabapentin prn for neuralgia or RLS - topical OTC - discussed iron supplementation for RLS in case ferritin is low - Lateral femoral nerve blocks   Lateral Femoral Cutaneous Nerve Block Patient Information  Description: The lateral femoral cutaneous nerve of the thigh is a purely sensory nerve that can become entrapped or irritated for a number of reasons.  The pain associated with this condition is called meralgia paraesthetica.  Patients affected with this syndrome have burning pain or abnormal sensation along the lateral aspect of the thigh.  The pain can be worsened by prolonged walking, standing, or constrictive garments around the house.   The diagnosis can be confirmed and treatment initiated by blocking the nerve with local anesthetic (like Novocaine).  At times, a steroid solution may be injected at the same time.  The site of injection is through a tiny needle in the left, lower quadrant of the abdomen.   The entire block usually lasts less than 5 minutes.  Conditions which may be treated by lateral femoral cutaneous nerve block:   Meralagia paraesthetica  Preparation for the injection:  1. Do not eat any solid food or dairy products within 8 hours of your appointment.  2. You may drink clear liquids up to 3 hours before appointment.  Clear liquids include water, black coffee, juice or soda. No milk or cream please. 3. You may take your regular medication, including pain medications, with a sip of water before your appointment.  Diabetics should hold regular insulin (if taken separately) and take 1/2 normal NPH dose the morning of the procedure.  Carry some sugar containing items with you to your appointment. 4. A driver must accompany you and be prepared to drive you home after your procedure. 5. Bring all  you current medications with you 6. An IV may be inserted and sedation may be given at the discretion of the physician. 7. A blood pressure cuff, EKG and other monitors will often be applied during the procedure.  Some patients may need to have extra oxygen administered for a short period. 8. You will be asked to provide medical information, including your allergies and medications, prior to the procedure.  We must know immediately if you are taking blood thinners (like Coumadin/Warfarin) or if you allergic to IV iodine contrast (dye)  We must know if you could possible be pregnant.   Possible side-effects:   Bleeding from needle site  Infection (rate, may require surgery)  Nerve injury (rare)  Numbness and Tingling (temporary)  Light-headedness (temporary)  Pain at injection site (several day)  Decreased blood pressure (rare, temporary)  Weakness in leg (temporary)  Call if you experience:  Hives or difficulty breathing (go to the emergency room)  Inflammation or drainage at the injection site(s)  Please note:  Although the local anesthetic injected can often make your leg feel good for several hours after the injection,  The pain may return.  It takes 3-7 days for steroids to work.  You may not notice any pain relief for at least one week.  If effective, we will often do a series of injections spaced 3-6 weeks apart to maximally decrease your pain.  If you have any questions, please cll [phone] [facility]  Lidocaine dermal patch What is this medicine? LIDOCAINE (LYE doe kane) causes loss of feeling  in the skin and surrounding area. The medicine helps treat pain, including nerve pain. This medicine may be used for other purposes; ask your health care provider or pharmacist if you have questions. COMMON BRAND NAME(S): Lidocare, Lidoderm What should I tell my health care provider before I take this medicine? They need to know if you  have any of these conditions: -heart disease -history of irregular heart beat -liver disease -skin conditions or sensitivity -skin infection -an unusual or allergic reaction to lidocaine, parabens, other medicines, foods, dyes, or preservatives -pregnant or trying to get pregnant -breast-feeding How should I use this medicine? This medicine is for external use only. Follow the directions on the prescription label or package. Talk to your pediatrician regarding the use of this medicine in children. While this drug may be prescribed for children as young as 12 years for selected conditions, precautions do apply. Overdosage: If you think you have taken too much of this medicine contact a poison control center or emergency room at once. NOTE: This medicine is only for you. Do not share this medicine with others. What if I miss a dose? Apply the patches as needed for pain. What may interact with this medicine? Do not take this medicine with any of the following medications: -certain medicines for irregular heart beat -MAOIs like Carbex, Eldepryl, Marplan, Nardil, and Parnate This medicine may also interact with the following medications: -other local anesthetics like pramoxine, tetracaine Do not use any other skin products on the affected area without asking your doctor or health care professional. This list may not describe all possible interactions. Give your health care provider a list of all the medicines, herbs, non-prescription drugs, or dietary supplements you use. Also tell them if you smoke, drink alcohol, or use illegal drugs. Some items may interact with your medicine. What should I watch for while using this medicine? Tell your doctor or healthcare professional if your symptoms do not start to get better or if they get worse. Be careful to avoid injury while the area is numb from the medicine, and you are not aware of pain. If you are going to need surgery, a MRI, CT scan, or other  procedure, tell your doctor that you are using this medicine. You may need to remove this patch before the procedure. Do not get this medicine in your eyes. If you do, rinse out with plenty of cool tap water. This medicine can make certain skin conditions worse. Only use it for conditions for which your doctor or health care professional has prescribed. What side effects may I notice from receiving this medicine? Side effects that you should report to your doctor or health care professional as soon as possible: -allergic reactions like skin rash, itching or hives, swelling of the face, lips, or tongue -breathing problems -chest pain or chest tightness -dizzines Side effects that usually do not require medical attention (report to your doctor or health care professional if they continue or are bothersome): -tingling, numbness at site where applied This list may not describe all possible side effects. Call your doctor for medical advice about side effects. You may report side effects to FDA at 1-800-FDA-1088. Where should I keep my medicine? Keep out of the reach of children. See product for storage instructions. Each product may have different instructions. NOTE: This sheet is a summary. It may not cover all possible information. If you have questions about this medicine, talk to your doctor, pharmacist, or health care provider.  2018 Elsevier/Gold Standard (  2014-11-04 11:05:19)   Gabapentin capsules or tablets What is this medicine? GABAPENTIN (GA ba pen tin) is used to control partial seizures in adults with epilepsy. It is also used to treat certain types of nerve pain. This medicine may be used for other purposes; ask your health care provider or pharmacist if you have questions. COMMON BRAND NAME(S): Active-PAC with Gabapentin, Gabarone, Neurontin What should I tell my health care provider before I take this medicine? They need to know if you have any of these conditions: -kidney  disease -suicidal thoughts, plans, or attempt; a previous suicide attempt by you or a family member -an unusual or allergic reaction to gabapentin, other medicines, foods, dyes, or preservatives -pregnant or trying to get pregnant -breast-feeding How should I use this medicine? Take this medicine by mouth with a glass of water. Follow the directions on the prescription label. You can take it with or without food. If it upsets your stomach, take it with food.Take your medicine at regular intervals. Do not take it more often than directed. Do not stop taking except on your doctor's advice. If you are directed to break the 600 or 800 mg tablets in half as part of your dose, the extra half tablet should be used for the next dose. If you have not used the extra half tablet within 28 days, it should be thrown away. A special MedGuide will be given to you by the pharmacist with each prescription and refill. Be sure to read this information carefully each time. Talk to your pediatrician regarding the use of this medicine in children. Special care may be needed. Overdosage: If you think you have taken too much of this medicine contact a poison control center or emergency room at once. NOTE: This medicine is only for you. Do not share this medicine with others. What if I miss a dose? If you miss a dose, take it as soon as you can. If it is almost time for your next dose, take only that dose. Do not take double or extra doses. What may interact with this medicine? Do not take this medicine with any of the following medications: -other gabapentin products This medicine may also interact with the following medications: -alcohol -antacids -antihistamines for allergy, cough and cold -certain medicines for anxiety or sleep -certain medicines for depression or psychotic disturbances -homatropine; hydrocodone -naproxen -narcotic medicines (opiates) for pain -phenothiazines like chlorpromazine, mesoridazine,  prochlorperazine, thioridazine This list may not describe all possible interactions. Give your health care provider a list of all the medicines, herbs, non-prescription drugs, or dietary supplements you use. Also tell them if you smoke, drink alcohol, or use illegal drugs. Some items may interact with your medicine. What should I watch for while using this medicine? Visit your doctor or health care professional for regular checks on your progress. You may want to keep a record at home of how you feel your condition is responding to treatment. You may want to share this information with your doctor or health care professional at each visit. You should contact your doctor or health care professional if your seizures get worse or if you have any new types of seizures. Do not stop taking this medicine or any of your seizure medicines unless instructed by your doctor or health care professional. Stopping your medicine suddenly can increase your seizures or their severity. Wear a medical identification bracelet or chain if you are taking this medicine for seizures, and carry a card that lists all your medications.  You may get drowsy, dizzy, or have blurred vision. Do not drive, use machinery, or do anything that needs mental alertness until you know how this medicine affects you. To reduce dizzy or fainting spells, do not sit or stand up quickly, especially if you are an older patient. Alcohol can increase drowsiness and dizziness. Avoid alcoholic drinks. Your mouth may get dry. Chewing sugarless gum or sucking hard candy, and drinking plenty of water will help. The use of this medicine may increase the chance of suicidal thoughts or actions. Pay special attention to how you are responding while on this medicine. Any worsening of mood, or thoughts of suicide or dying should be reported to your health care professional right away. Women who become pregnant while using this medicine may enroll in the [place] Pregnancy Registry by calling [phone]. This registry collects information about the safety of antiepileptic drug use during pregnancy. What side effects may I notice from receiving this medicine? Side effects that you should report to your doctor or health care professional as soon as possible: -allergic reactions like skin rash, itching or hives, swelling of the face, lips, or tongue -worsening of mood, thoughts or actions of suicide or dying Side effects that usually do not require medical attention (report to your doctor or health care professional if they continue or are bothersome): -constipation -difficulty walking or controlling muscle movements -dizziness -nausea -slurred speech -tiredness -tremors -weight gain This list may not describe all possible side effects. Call your doctor for medical advice about side effects. You may report side effects to FDA at 1-800-FDA-1088. Where should I keep my medicine? Keep out of reach of children. This medicine may cause accidental overdose and death if it taken by other adults, children, or pets. Mix any unused medicine with a substance like cat litter or coffee grounds. Then throw the medicine away in a sealed container like a sealed bag or a coffee can with a lid. Do not use the medicine after the expiration date. Store at room temperature between 15 and 30 degrees C (59 and 86 degrees F). NOTE: This sheet is a summary. It may not cover all possible information. If you have questions about this medicine, talk to your doctor, pharmacist, or health care provider.  2018 Elsevier/Gold Standard (2013-02-19 15:26:50)

## 2017-10-16 MED FILL — ALPRAZolam 0.5 MG TABS: 0.5 | 10 days supply | Qty: 30 | Fill #0

## 2017-10-16 MED FILL — LEVOCETIRIZINE 5 MG TABLET: 5 | 30 days supply | Qty: 30 | Fill #0

## 2017-11-11 MED FILL — LEVOCETIRIZINE 5 MG TABLET: 5 | 30 days supply | Qty: 30 | Fill #1

## 2017-12-22 MED FILL — LEVOCETIRIZINE 5 MG TABLET: 5 | 30 days supply | Qty: 30 | Fill #2

## 2018-01-22 MED FILL — LEVOCETIRIZINE 5 MG TABLET: 5 | 30 days supply | Qty: 30 | Fill #0

## 2018-02-28 MED FILL — LEVOCETIRIZINE 5 MG TABLET: 5 | 30 days supply | Qty: 30 | Fill #1

## 2018-03-24 ENCOUNTER — Telehealth: Payer: No Typology Code available for payment source | Admitting: Nurse Practitioner

## 2018-03-24 DIAGNOSIS — J00 Acute nasopharyngitis [common cold]: Secondary | ICD-10-CM

## 2018-03-24 MED ORDER — FLUTICASONE PROPIONATE 50 MCG/ACT NA SUSP: 2.0000 | Freq: Every day | NASAL | 6 refills | Status: DC

## 2018-03-24 MED ORDER — BENZONATATE 100 MG PO CAPS: 100.0000 mg | ORAL_CAPSULE | Freq: Three times a day (TID) | ORAL | 0 refills | Status: DC | PRN

## 2018-03-24 MED FILL — BENZONATATE 100 MG CAPS: 100 | 7 days supply | Qty: 20 | Fill #0

## 2018-03-24 MED FILL — FLUTICASONE PROP 50 MCG SPR: 50 | 30 days supply | Qty: 16 | Fill #0

## 2018-03-24 NOTE — Progress Notes (Signed)
We are sorry you are not feeling well.  Here is how we plan to help!  Based on what you have shared with me, it looks like you may have a viral upper respiratory infection.  Upper respiratory infections are caused by a large number of viruses; however, rhinovirus is the most common cause.   Coronavirus (COVID-19) Are you at risk?  Are you at risk for the Coronavirus (COVID-19)?  To be considered HIGH RISK for Coronavirus (COVID-19), you have to meet the following criteria:  . Traveled to [place], [place], [place], [place] or [place]; or in the [place] to [place], [place], [place], or [place]; and have fever, cough, and shortness of breath within the last 2 weeks of travel OR . Been in close contact with a person diagnosed with COVID-19 within the last 2 weeks and have fever, cough, and shortness of breath . IF YOU DO NOT MEET THESE CRITERIA, YOU ARE CONSIDERED LOW RISK FOR COVID-19.  What to do if you are HIGH RISK for COVID-19?  Marland Kitchen If you are having a medical emergency, call 911. . Seek medical care right away. Before you go to a doctor's office, urgent care or emergency department, call ahead and tell them about your recent travel, contact with someone diagnosed with COVID-19, and your symptoms. You should receive instructions from your physician's office regarding next steps of care.  . When you arrive at healthcare provider, tell the healthcare staff immediately you have returned from visiting [place], [place], [place], [place] or [place]; or traveled in the [place] to [place], [place], [place], or [place]; in the last two weeks or you have been in close contact with a person diagnosed with COVID-19 in the last 2 weeks.   . Tell the health care staff about your symptoms: fever, cough and shortness of breath. . After you have been seen by a medical provider, you will be either: o Tested for (COVID-19) and discharged home on quarantine except to seek medical care if  symptoms worsen, and asked to  - Stay home and avoid contact with others until you get your results (4-5 days)  - Avoid travel on public transportation if possible (such as bus, train, or airplane) or o Sent to the Emergency Department by EMS for evaluation, COVID-19 testing, and possible admission depending on your condition and test results.  What to do if you are LOW RISK for COVID-19?  Reduce your risk of any infection by using the same precautions used for avoiding the common cold or flu:  Marland Kitchen Wash your hands often with soap and warm water for at least 20 seconds.  If soap and water are not readily available, use an alcohol-based hand sanitizer with at least 60% alcohol.  . If coughing or sneezing, cover your mouth and nose by coughing or sneezing into the elbow areas of your shirt or coat, into a tissue or into your sleeve (not your hands). . Avoid shaking hands with others and consider head nods or verbal greetings only. . Avoid touching your eyes, nose, or mouth with unwashed hands.  . Avoid close contact with people who are sick. . Avoid places or events with large numbers of people in one location, like concerts or sporting events. . Carefully consider travel plans you have or are making. . If you are planning any travel outside or inside the [place], visit the CDC's Travelers' Health webpage for the latest health notices. . If you have some symptoms  but not all symptoms, continue to monitor at home and seek medical attention if your symptoms worsen. . If you are having a medical emergency, call 911.   [place] / e-Visit: [email or web address]         [facility]: [phone]  [facility]: [phone]                   [facility]: [phone]    Symptoms vary from person to person, with common symptoms including sore throat, cough, and fatigue  or lack of energy.  A low-grade fever of up to 100.4 may present, but is often uncommon.  Symptoms vary however, and are closely related to a person's age or underlying illnesses.  The most common symptoms associated with an upper respiratory infection are nasal discharge or congestion, cough, sneezing, headache and pressure in the ears and face.  These symptoms usually persist for about 3 to 10 days, but can last up to 2 weeks.  It is important to know that upper respiratory infections do not cause serious illness or complications in most cases.    Upper respiratory infections can be transmitted from person to person, with the most common method of transmission being a person's hands.  The virus is able to live on the skin and can infect other persons for up to 2 hours after direct contact.  Also, these can be transmitted when someone coughs or sneezes; thus, it is important to cover the mouth to reduce this risk.  To keep the spread of the illness at [place], good hand hygiene is very important.  This is an infection that is most likely caused by a virus. There are no specific treatments other than to help you with the symptoms until the infection runs its course.  We are sorry you are not feeling well.  Here is how we plan to help!   For nasal congestion, you may use an oral decongestants such as Mucinex D or if you have glaucoma or high blood pressure use plain Mucinex.  Saline nasal spray or nasal drops can help and can safely be used as often as needed for congestion.  For your congestion, I have prescribed Fluticasone nasal spray one spray in each nostril twice a day  If you do not have a history of heart disease, hypertension, diabetes or thyroid disease, prostate/bladder issues or glaucoma, you may also use Sudafed to treat nasal congestion.  It is highly recommended that you consult with a pharmacist or your primary care physician to ensure this medication is safe for you to take.     If you have a  cough, you may use cough suppressants such as Delsym and Robitussin.  If you have glaucoma or high blood pressure, you can also use Coricidin HBP.   For cough I have prescribed for you A prescription cough medication called Tessalon Perles 100 mg. You may take 1-2 capsules every 8 hours as needed for cough  If you have a sore or scratchy throat, use a saltwater gargle-  to  teaspoon of salt dissolved in a 4-ounce to 8-ounce glass of warm water.  Gargle the solution for approximately 15-30 seconds and then spit.  It is important not to swallow the solution.  You can also use throat lozenges/cough drops and Chloraseptic spray to help with throat pain or discomfort.  Warm or cold liquids can also be helpful in relieving throat pain.  For headache, pain or general discomfort, you can use Ibuprofen or Tylenol as directed.   Some authorities believe that zinc sprays or the use of Echinacea may shorten the course of your symptoms.   HOME CARE . Only take medications as instructed by your medical team. . Be sure to drink plenty of fluids. Water is fine as well as fruit juices, sodas and electrolyte beverages. You may want to stay away from caffeine or alcohol. If you are nauseated, try taking small sips of liquids. How do you know if you are getting enough fluid? Your urine should be a pale yellow or almost colorless. . Get rest. . Taking a steamy shower or using a humidifier may help nasal congestion and ease sore throat pain. You can place a towel over your head and breathe in the steam from hot water coming from a faucet. . Using a saline nasal spray works much the same way. . Cough drops, hard candies and sore throat lozenges may ease your cough. . Avoid close contacts especially the very young and the elderly . Cover your mouth if you cough or sneeze . Always remember to wash your hands.   GET HELP RIGHT AWAY IF: . You develop worsening fever. . If your symptoms do not improve within 10  days . You develop yellow or green discharge from your nose over 3 days. . You have coughing fits . You develop a severe head ache or visual changes. . You develop shortness of breath, difficulty breathing or start having chest pain . Your symptoms persist after you have completed your treatment plan  MAKE SURE YOU   Understand these instructions.  Will watch your condition.  Will get help right away if you are not doing well or get worse.  Your e-visit answers were reviewed by a board certified advanced clinical practitioner to complete your personal care plan. Depending upon the condition, your plan could have included both over the counter or prescription medications. Please review your pharmacy choice. If there is a problem, you may call our nursing hot line at and have the prescription routed to another pharmacy. Your safety is important to [place]. If you have drug allergies check your prescription carefully.   You can use MyChart to ask questions about today's visit, request a non-urgent call back, or ask for a work or school excuse for 24 hours related to this e-Visit. If it has been greater than 24 hours you will need to follow up with your provider, or enter a new e-Visit to address those concerns. You will get an e-mail in the next two days asking about your experience.  I hope that your e-visit has been valuable and will speed your recovery. Thank you for using e-visits.

## 2018-03-25 MED FILL — LEVOCETIRIZINE 5 MG TABLET: 5 | 30 days supply | Qty: 30 | Fill #0

## 2018-04-02 ENCOUNTER — Telehealth: Payer: No Typology Code available for payment source | Admitting: Family

## 2018-04-02 DIAGNOSIS — B9689 Other specified bacterial agents as the cause of diseases classified elsewhere: Secondary | ICD-10-CM | POA: Diagnosis not present

## 2018-04-02 DIAGNOSIS — J019 Acute sinusitis, unspecified: Secondary | ICD-10-CM | POA: Diagnosis not present

## 2018-04-02 MED ORDER — AMOXICILLIN-POT CLAVULANATE 875-125 MG PO TABS: 1.0000 | ORAL_TABLET | Freq: Two times a day (BID) | ORAL | 0 refills | Status: DC

## 2018-04-02 NOTE — Progress Notes (Signed)

## 2018-04-07 MED FILL — AMOX-CLAV 875-125 MG TABLET: 875-125 | 7 days supply | Qty: 14 | Fill #0

## 2018-04-27 MED FILL — ALPRAZolam 0.5 MG TABS: 0.5 | 10 days supply | Qty: 30 | Fill #0

## 2018-04-27 MED FILL — LEVOCETIRIZINE 5 MG TABLET: 5 | 30 days supply | Qty: 30 | Fill #0 | Status: TO

## 2018-06-02 MED FILL — LEVOCETIRIZINE 5 MG TABLET: 5 | 30 days supply | Qty: 30 | Fill #0

## 2018-07-08 MED FILL — LEVOCETIRIZINE 5 MG TABLET: 5 | 30 days supply | Qty: 30 | Fill #1

## 2018-08-07 MED FILL — LEVOCETIRIZINE 5 MG TABLET: 5 | 30 days supply | Qty: 30 | Fill #2

## 2018-08-26 MED FILL — SPIRONOLACTONE 50 MG TABLET: 50 | 30 days supply | Qty: 60 | Fill #0

## 2018-08-26 MED FILL — CLINDAMYCIN PHOSP 1% LOTION: 1 | 30 days supply | Qty: 60 | Fill #0

## 2018-09-07 MED FILL — LEVOCETIRIZINE 5 MG TABLET: 5 | 30 days supply | Qty: 30 | Fill #3

## 2018-09-21 MED FILL — MYORISAN 40 MG CAPSULE: 40 | 30 days supply | Qty: 30 | Fill #0

## 2018-10-07 MED FILL — LEVOCETIRIZINE 5 MG TABLET: 5 | 30 days supply | Qty: 30 | Fill #1

## 2018-10-21 MED FILL — MYORISAN 40 MG CAPSULE: 40 | 30 days supply | Qty: 60 | Fill #0

## 2018-11-06 MED FILL — LEVOCETIRIZINE 5 MG TABLET: 5 | 30 days supply | Qty: 30 | Fill #0

## 2018-11-13 MED FILL — TRIAMCINOLONE 0.1% CREAM: 0.1 | 30 days supply | Qty: 80 | Fill #0

## 2018-11-20 MED FILL — MYORISAN 40 MG CAPSULE: 40 | 30 days supply | Qty: 60 | Fill #0

## 2018-12-07 MED FILL — LEVOCETIRIZINE 5 MG TABLET: 5 | 30 days supply | Qty: 30 | Fill #1

## 2018-12-21 MED FILL — MYORISAN 40 MG CAPSULE: 40 | 30 days supply | Qty: 60 | Fill #0

## 2018-12-25 ENCOUNTER — Ambulatory Visit: Payer: No Typology Code available for payment source | Attending: Internal Medicine

## 2018-12-25 DIAGNOSIS — Z20822 Contact with and (suspected) exposure to covid-19: Secondary | ICD-10-CM

## 2018-12-26 LAB — NOVEL CORONAVIRUS, NAA: SARS-CoV-2, NAA: NOT DETECTED

## 2019-01-11 MED FILL — LEVOCETIRIZINE 5 MG TABLET: 5 | 30 days supply | Qty: 30 | Fill #0

## 2019-01-22 MED FILL — MYORISAN 40 MG CAPSULE: 40 | 30 days supply | Qty: 60 | Fill #0

## 2019-02-10 MED FILL — LEVOCETIRIZINE 5 MG TABLET: 5 | 30 days supply | Qty: 30 | Fill #1

## 2019-02-25 MED FILL — MYORISAN 40 MG CAPSULE: 40 | 30 days supply | Qty: 60 | Fill #0

## 2019-03-11 MED FILL — LEVOCETIRIZINE 5 MG TABLET: 5 | 90 days supply | Qty: 90 | Fill #0

## 2019-03-12 MED FILL — metroNIDAZOLE 500 MG TABS: 500 | 7 days supply | Qty: 14 | Fill #0

## 2019-04-01 MED FILL — MYORISAN 40 MG CAPSULE: 40 | 30 days supply | Qty: 60 | Fill #0

## 2019-05-05 ENCOUNTER — Telehealth: Payer: No Typology Code available for payment source | Admitting: Family

## 2019-05-05 DIAGNOSIS — J019 Acute sinusitis, unspecified: Secondary | ICD-10-CM

## 2019-05-05 MED ORDER — AMOXICILLIN-POT CLAVULANATE 875-125 MG PO TABS: 1.0000 | ORAL_TABLET | Freq: Two times a day (BID) | ORAL | 0 refills | Status: DC

## 2019-05-05 MED FILL — AMOX-CLAV 875-125 MG TABLET: 875-125 | 7 days supply | Qty: 14 | Fill #0

## 2019-05-05 MED FILL — TRETINOIN 0.05 % CREA: 0.05 | 30 days supply | Qty: 45 | Fill #0

## 2019-05-05 NOTE — Progress Notes (Signed)

## 2019-05-27 IMAGING — US US ART/VEN ABD/PELV/SCROTUM DOPPLER LTD
1 series · 15 of 25 positions shown · non-contrast
Comparison: 11/02/2016

CLINICAL DATA: Increased pelvic pain, right ovarian cysts.

EXAM:
TRANSABDOMINAL AND TRANSVAGINAL ULTRASOUND OF PELVIS
DOPPLER ULTRASOUND OF OVARIES
TECHNIQUE: Both transabdominal and transvaginal ultrasound examinations of the
pelvis were performed. Transabdominal technique was performed for
global imaging of the pelvis including uterus, ovaries, adnexal
regions, and pelvic cul-de-sac.
It was necessary to proceed with endovaginal exam following the
transabdominal exam to visualize the uterus, endometrium and
ovaries. Color and duplex Doppler ultrasound was utilized to
evaluate blood flow to the ovaries.

[Series 1: us art/ven abd/pelv/scrotum doppler ltd · 15 of 44 slices shown]
[im 1/44]
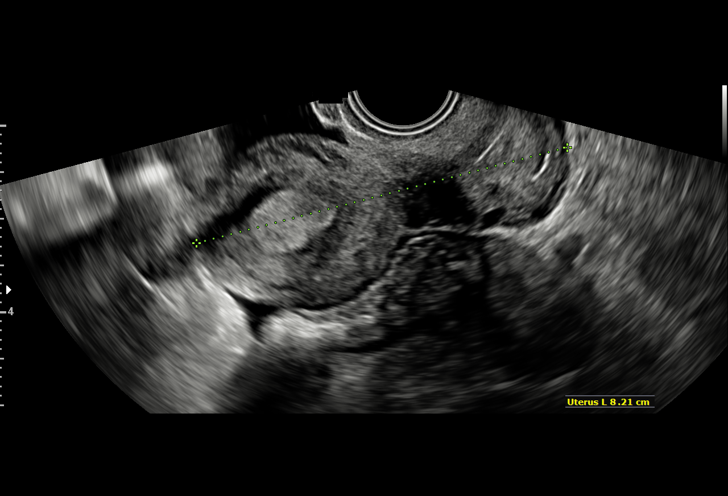
[im 4/44]
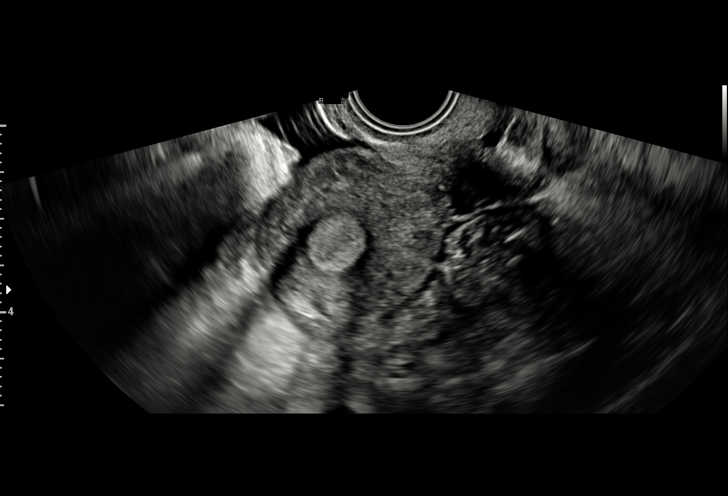
[im 8/44]
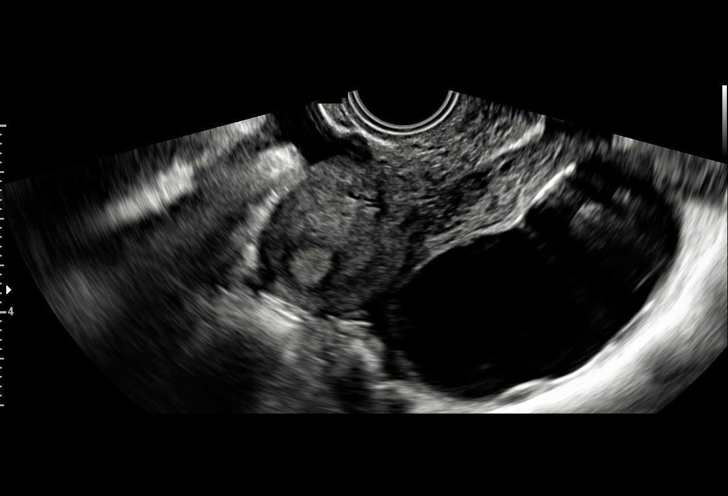
[im 9/44]
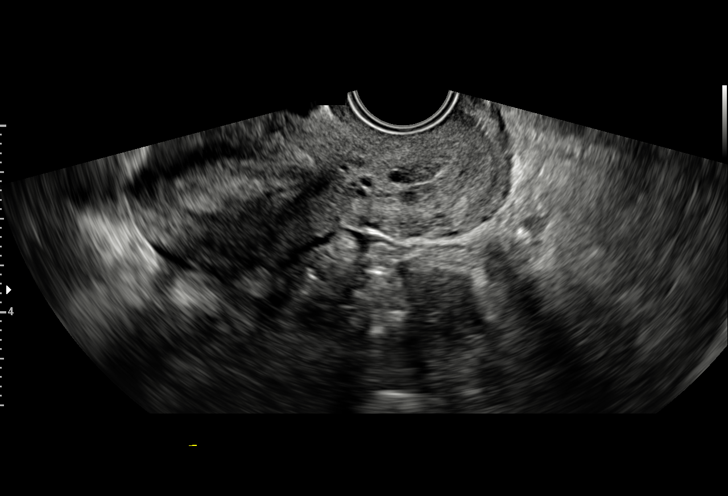
[im 13/44]
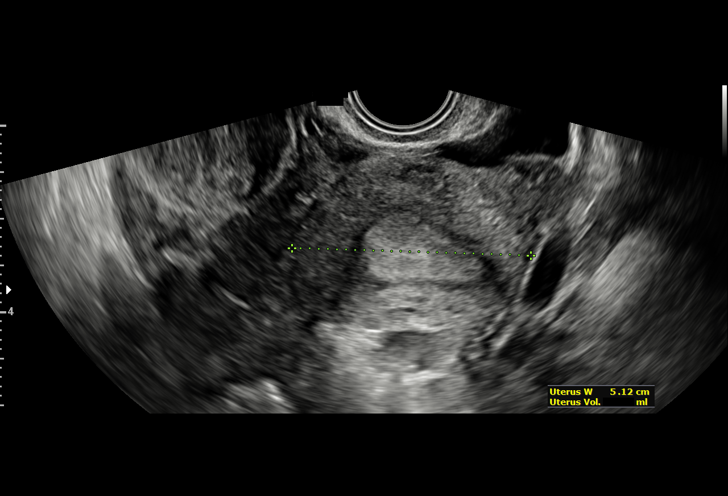
[im 17/44]
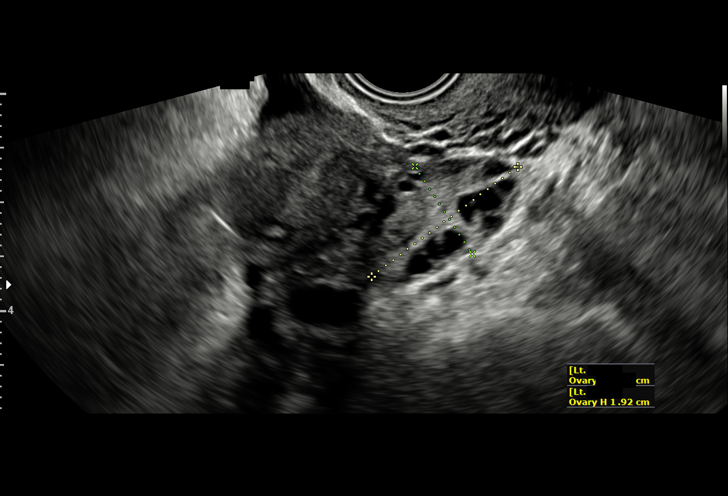
[im 18/44]
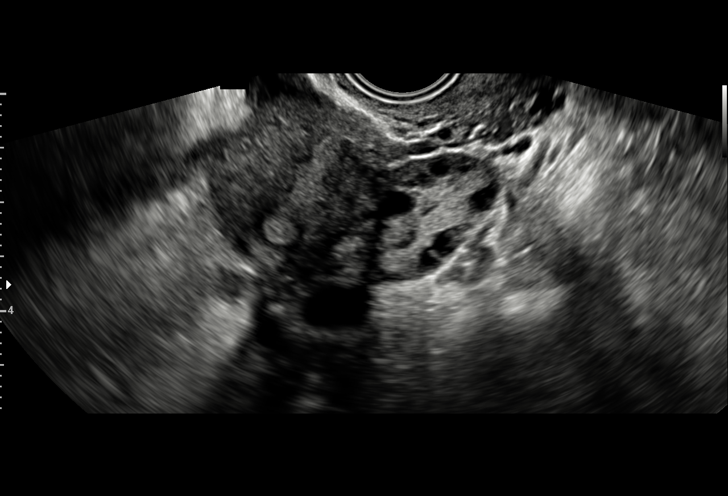
[im 22/44]
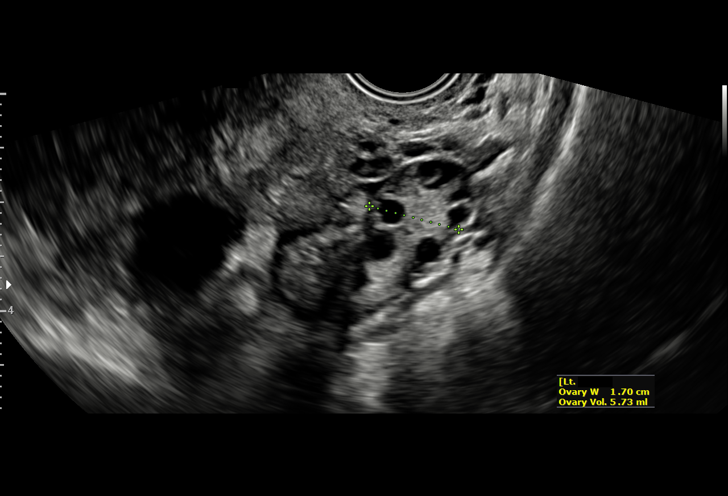
[im 26/44]
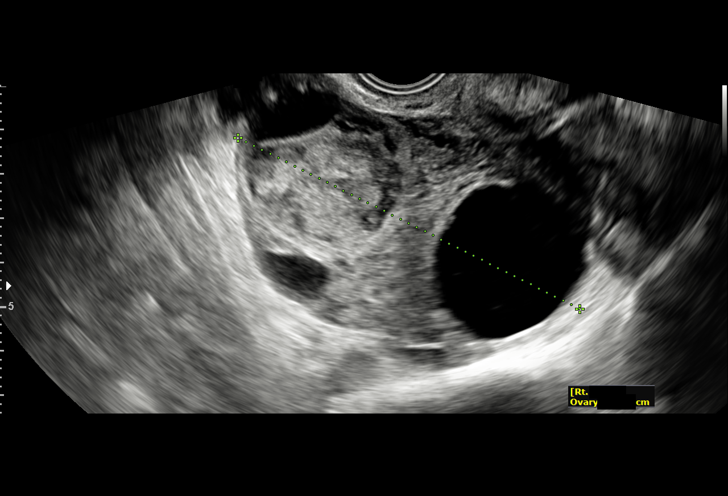
[im 27/44]
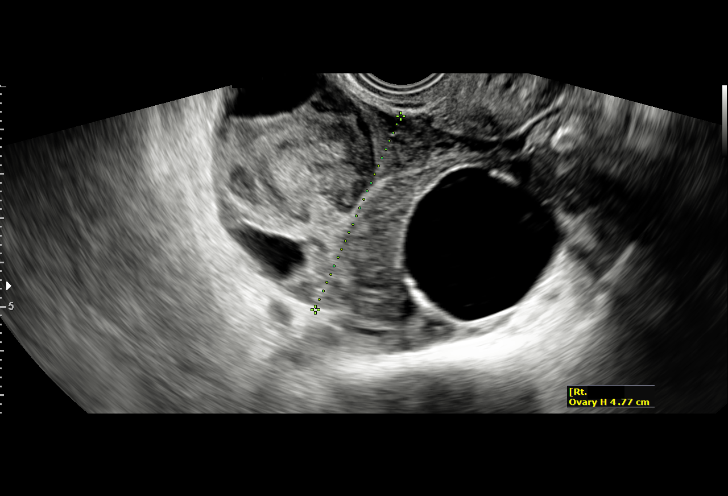
[im 31/44]
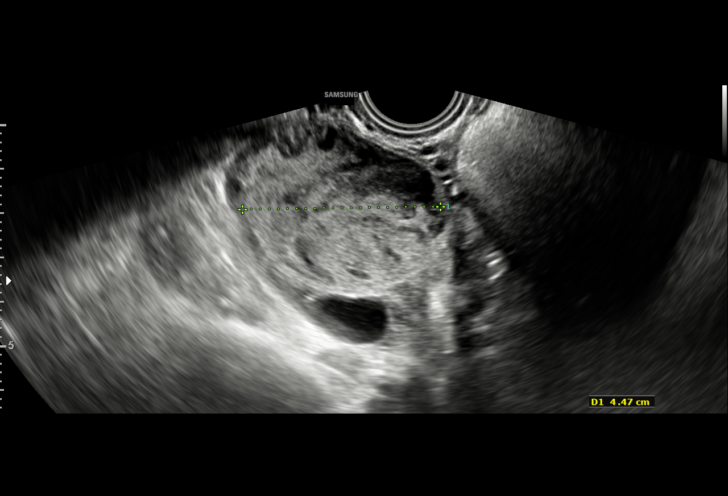
[im 35/44]
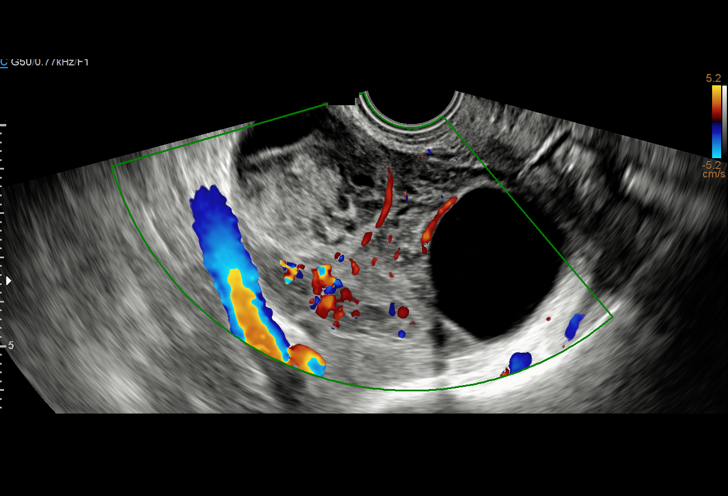
[im 36/44]
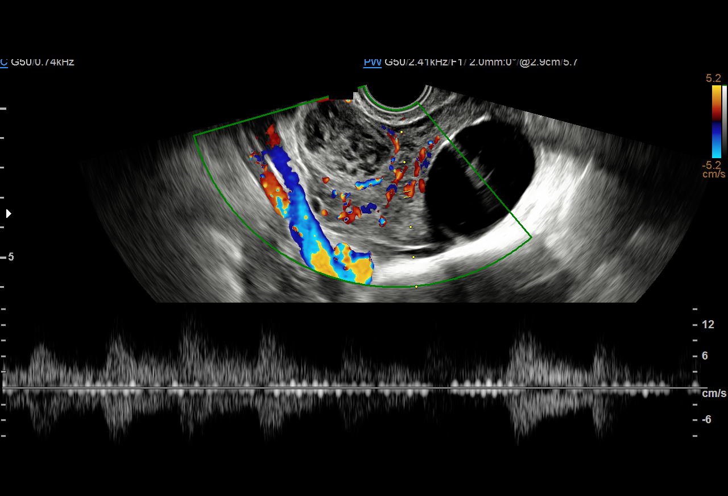
[im 40/44]
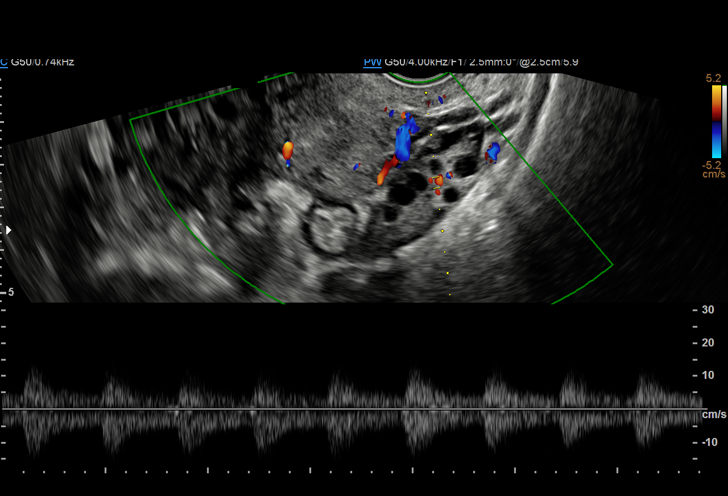
[im 44/44]
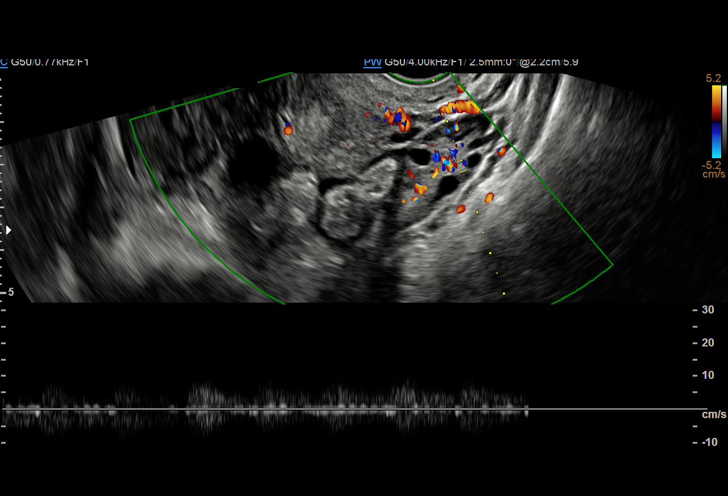

[15 of 25 positions shown; findings below may reference images not displayed]

FINDINGS: Uterus

Measurements: 8.2 x 3.7 x 5.1. No fibroids or other mass visualized.

Endometrium

Thickness: 11.9 mm.  No focal abnormality visualized.

Right ovary

Measurements: 8.2 x 4.8 x 6.7 cm. There is now a hemorrhagic cyst
with fluid-debris level currently measuring 4.2 x 3.8 x 4.5 cm,
previously anechoic and simple in appearance measuring approximately
3 x 2.2 cm in two orthogonal planes. The largest cyst is stable with
an anechoic in appearance measuring 5.2 x 3.6 x 4.9 cm, previously
measuring 6.3 x 3.9 cm in two orthogonal planes.

Left ovary

Measurements: 3.4 x 1.9 x 1.7. Normal appearance/no adnexal mass.

Pulsed Doppler evaluation of both ovaries demonstrates normal
low-resistance arterial and venous waveforms.

Other findings

Moderate free fluid is noted.
IMPRESSION: 1. There appears to have been rupture of the median sized simple
cyst previously described, now demonstrating a fluid-debris level
and moderate free fluid in the pelvis.
2. Stable anechoic dominant simple cyst of the right ovary measuring
approximately 5 point 2 x 3.6 x 4.9 cm.
3. No ovarian torsion.

## 2019-06-08 MED FILL — LEVOCETIRIZINE 5 MG TABLET: 5 | 90 days supply | Qty: 90 | Fill #1

## 2019-08-09 MED FILL — METHYLPREDNISOLONE 4 MG TAB: 4 | 6 days supply | Qty: 21 | Fill #0

## 2019-08-23 MED FILL — METHYLPREDNISOLONE 4 MG TAB: 4 | 6 days supply | Qty: 21 | Fill #0

## 2019-09-07 MED FILL — LEVOCETIRIZINE 5 MG TABLET: 5 | 90 days supply | Qty: 90 | Fill #2

## 2019-12-14 MED FILL — LEVOCETIRIZINE 5 MG TABLET: 5 | 90 days supply | Qty: 90 | Fill #3

## 2020-02-04 ENCOUNTER — Other Ambulatory Visit: Payer: Self-pay | Admitting: Nurse Practitioner

## 2020-02-04 ENCOUNTER — Telehealth: Payer: No Typology Code available for payment source | Admitting: Nurse Practitioner

## 2020-02-04 DIAGNOSIS — J029 Acute pharyngitis, unspecified: Secondary | ICD-10-CM | POA: Diagnosis not present

## 2020-02-04 MED ORDER — PREDNISONE 20 MG PO TABS: 40.0000 mg | ORAL_TABLET | Freq: Every day | ORAL | 0 refills | Status: DC

## 2020-02-04 MED FILL — predniSONE 20 MG TABS: 20 | 5 days supply | Qty: 10 | Fill #0

## 2020-02-04 NOTE — Addendum Note (Signed)
Addended by: Chevis Pretty on: 02/04/2020 12:40 PM   Modules accepted: Orders

## 2020-02-04 NOTE — Progress Notes (Signed)

## 2020-03-20 ENCOUNTER — Other Ambulatory Visit (HOSPITAL_COMMUNITY): Payer: Self-pay | Admitting: Internal Medicine

## 2020-04-13 ENCOUNTER — Other Ambulatory Visit (HOSPITAL_COMMUNITY): Payer: Self-pay

## 2020-04-13 MED ORDER — LEVOCETIRIZINE DIHYDROCHLORIDE 5 MG PO TABS
5.0000 mg | ORAL_TABLET | Freq: Every evening | ORAL | 0 refills | Status: DC
  Filled 2020-04-13: qty 30, 30d supply, fill #0

## 2020-04-13 MED ORDER — ONDANSETRON 4 MG PO TBDP
4.0000 mg | ORAL_TABLET | Freq: Three times a day (TID) | ORAL | 0 refills | Status: DC | PRN
  Filled 2020-04-13: qty 30, 10d supply, fill #0

## 2020-04-14 ENCOUNTER — Other Ambulatory Visit (HOSPITAL_COMMUNITY): Payer: Self-pay

## 2020-04-14 MED ORDER — ALPRAZOLAM 0.5 MG PO TABS
0.2500 mg | ORAL_TABLET | Freq: Three times a day (TID) | ORAL | 2 refills | Status: DC | PRN
  Filled 2020-04-14: qty 30, 10d supply, fill #0

## 2020-05-24 ENCOUNTER — Other Ambulatory Visit (HOSPITAL_COMMUNITY): Payer: Self-pay

## 2020-05-25 ENCOUNTER — Other Ambulatory Visit (HOSPITAL_COMMUNITY): Payer: Self-pay

## 2020-05-25 MED ORDER — LEVOCETIRIZINE DIHYDROCHLORIDE 5 MG PO TABS
5.0000 mg | ORAL_TABLET | Freq: Every day | ORAL | 2 refills | Status: DC
  Filled 2020-05-25: qty 30, 30d supply, fill #0
  Filled 2020-06-19: qty 30, 30d supply, fill #1
  Filled 2020-07-23: qty 30, 30d supply, fill #2

## 2020-06-20 ENCOUNTER — Other Ambulatory Visit (HOSPITAL_COMMUNITY): Payer: Self-pay

## 2020-07-24 ENCOUNTER — Other Ambulatory Visit (HOSPITAL_COMMUNITY): Payer: Self-pay

## 2020-08-20 ENCOUNTER — Other Ambulatory Visit (HOSPITAL_COMMUNITY): Payer: Self-pay

## 2020-08-21 ENCOUNTER — Other Ambulatory Visit (HOSPITAL_COMMUNITY): Payer: Self-pay

## 2020-08-22 ENCOUNTER — Other Ambulatory Visit (HOSPITAL_COMMUNITY): Payer: Self-pay

## 2020-08-22 MED ORDER — LEVOCETIRIZINE DIHYDROCHLORIDE 5 MG PO TABS
5.0000 mg | ORAL_TABLET | Freq: Every day | ORAL | 2 refills | Status: DC
  Filled 2020-08-22: qty 90, 90d supply, fill #0

## 2020-11-20 ENCOUNTER — Other Ambulatory Visit (HOSPITAL_COMMUNITY): Payer: Self-pay

## 2020-11-21 ENCOUNTER — Other Ambulatory Visit (HOSPITAL_COMMUNITY): Payer: Self-pay

## 2020-11-21 MED ORDER — LEVOCETIRIZINE DIHYDROCHLORIDE 5 MG PO TABS
5.0000 mg | ORAL_TABLET | Freq: Every day | ORAL | 2 refills | Status: DC
  Filled 2020-11-21: qty 90, 90d supply, fill #0

## 2021-02-19 ENCOUNTER — Other Ambulatory Visit (HOSPITAL_COMMUNITY): Payer: Self-pay

## 2021-02-20 ENCOUNTER — Other Ambulatory Visit (HOSPITAL_COMMUNITY): Payer: Self-pay

## 2021-02-20 MED ORDER — LEVOCETIRIZINE DIHYDROCHLORIDE 5 MG PO TABS
5.0000 mg | ORAL_TABLET | Freq: Every day | ORAL | 0 refills | Status: DC
  Filled 2021-02-20: qty 30, 30d supply, fill #0

## 2021-02-27 ENCOUNTER — Other Ambulatory Visit (HOSPITAL_COMMUNITY): Payer: Self-pay

## 2021-02-27 MED ORDER — ONDANSETRON 4 MG PO TBDP
4.0000 mg | ORAL_TABLET | Freq: Three times a day (TID) | ORAL | 1 refills | Status: DC
  Filled 2021-02-27: qty 30, 10d supply, fill #0

## 2021-02-27 MED ORDER — LEVOCETIRIZINE DIHYDROCHLORIDE 5 MG PO TABS
5.0000 mg | ORAL_TABLET | Freq: Every day | ORAL | 4 refills | Status: DC
  Filled 2021-02-27 – 2021-03-19 (×2): qty 90, 90d supply, fill #0
  Filled 2021-06-19: qty 90, 90d supply, fill #1
  Filled 2021-09-12: qty 90, 90d supply, fill #2
  Filled 2021-12-19: qty 90, 90d supply, fill #3

## 2021-02-27 MED ORDER — ALPRAZOLAM 0.5 MG PO TABS
0.5000 mg | ORAL_TABLET | Freq: Two times a day (BID) | ORAL | 0 refills | Status: DC | PRN
  Filled 2021-02-27: qty 60, 30d supply, fill #0

## 2021-03-20 ENCOUNTER — Other Ambulatory Visit (HOSPITAL_COMMUNITY): Payer: Self-pay

## 2021-06-20 ENCOUNTER — Other Ambulatory Visit (HOSPITAL_COMMUNITY): Payer: Self-pay

## 2021-09-13 ENCOUNTER — Other Ambulatory Visit (HOSPITAL_COMMUNITY): Payer: Self-pay

## 2021-12-19 ENCOUNTER — Other Ambulatory Visit (HOSPITAL_COMMUNITY): Payer: Self-pay

## 2022-03-01 ENCOUNTER — Other Ambulatory Visit: Payer: Self-pay | Admitting: Obstetrics

## 2022-03-01 DIAGNOSIS — N6320 Unspecified lump in the left breast, unspecified quadrant: Secondary | ICD-10-CM

## 2022-03-12 ENCOUNTER — Other Ambulatory Visit (HOSPITAL_COMMUNITY): Payer: Self-pay

## 2022-03-12 DIAGNOSIS — Z139 Encounter for screening, unspecified: Secondary | ICD-10-CM | POA: Diagnosis not present

## 2022-03-12 MED ORDER — ONDANSETRON 4 MG PO TBDP
4.0000 mg | ORAL_TABLET | Freq: Three times a day (TID) | ORAL | 1 refills | Status: DC
  Filled 2022-03-12: qty 30, 10d supply, fill #0
  Filled 2022-12-09: qty 30, 10d supply, fill #1

## 2022-03-12 MED ORDER — ALPRAZOLAM 0.5 MG PO TABS
0.5000 mg | ORAL_TABLET | Freq: Two times a day (BID) | ORAL | 0 refills | Status: DC
  Filled 2022-03-14: qty 60, 30d supply, fill #0

## 2022-03-12 MED ORDER — LEVOCETIRIZINE DIHYDROCHLORIDE 5 MG PO TABS
5.0000 mg | ORAL_TABLET | Freq: Every day | ORAL | 4 refills | Status: DC
  Filled 2022-03-12: qty 90, 90d supply, fill #0
  Filled 2022-05-27 (×2): qty 90, 90d supply, fill #1
  Filled 2022-09-02: qty 90, 90d supply, fill #2
  Filled 2022-12-09: qty 90, 90d supply, fill #3

## 2022-03-13 ENCOUNTER — Ambulatory Visit
Admission: RE | Admit: 2022-03-13 | Discharge: 2022-03-13 | Disposition: A | Discharge status: Home / Self Care | Payer: 59 | Source: Ambulatory Visit | Attending: Obstetrics | Admitting: Obstetrics

## 2022-03-13 ENCOUNTER — Other Ambulatory Visit: Payer: Self-pay | Admitting: Obstetrics

## 2022-03-13 DIAGNOSIS — N6321 Unspecified lump in the left breast, upper outer quadrant: Secondary | ICD-10-CM | POA: Diagnosis not present

## 2022-03-13 DIAGNOSIS — N6323 Unspecified lump in the left breast, lower outer quadrant: Secondary | ICD-10-CM | POA: Diagnosis not present

## 2022-03-13 DIAGNOSIS — N6320 Unspecified lump in the left breast, unspecified quadrant: Secondary | ICD-10-CM

## 2022-03-13 DIAGNOSIS — R922 Inconclusive mammogram: Secondary | ICD-10-CM | POA: Diagnosis not present

## 2022-03-13 DIAGNOSIS — N6311 Unspecified lump in the right breast, upper outer quadrant: Secondary | ICD-10-CM | POA: Diagnosis not present

## 2022-03-13 DIAGNOSIS — R928 Other abnormal and inconclusive findings on diagnostic imaging of breast: Secondary | ICD-10-CM

## 2022-03-14 ENCOUNTER — Other Ambulatory Visit: Payer: Self-pay | Admitting: Obstetrics

## 2022-03-14 ENCOUNTER — Other Ambulatory Visit: Payer: Self-pay

## 2022-03-14 DIAGNOSIS — D241 Benign neoplasm of right breast: Secondary | ICD-10-CM

## 2022-04-09 ENCOUNTER — Telehealth: Payer: 59 | Admitting: Physician Assistant

## 2022-04-09 DIAGNOSIS — R197 Diarrhea, unspecified: Secondary | ICD-10-CM

## 2022-04-09 NOTE — Progress Notes (Signed)
Because this has been intermittent and > 1 week in duration with some evolving and waxing/waning symptoms which could indicate underlying inflammatory condition versus true viral infection, etc, I feel your condition warrants further evaluation and I recommend that you be seen in a face to face visit.   NOTE: There will be NO CHARGE for this eVisit   If you are having a true medical emergency please call 911.      For an urgent face to face visit, [facility] has eight urgent care centers for your convenience:   NEW!! [facility] [place] [facility] [place] Get Driving Directions [id number] [address] [place], [place] [facility] [place] [facility] [place] Get Driving Directions [id number] [place], [place] [address]   [facility] [place] [facility]) Get Driving Directions [id number] [address], [place] [address]  [facility] [place] ([place]) Get Driving Directions [id number] [address] [place],  [place]  [address]  [place] [facility] [address] Get Driving Directions  [phone] [address] [place],  [place] [address]   [facility] Get Driving Directions [id number] [place], [place], [place] [address]   [place] [facility] Get Driving Directions  [id number] [address] [place],  [address]   [facility] [place] Get Driving Directions [id number] [address] [place],  [address]  Your MyChart E-visit questionnaire answers were reviewed by a board certified advanced clinical practitioner to complete your personal care plan based on your specific symptoms.  Thank you for using e-Visits.

## 2022-04-16 DIAGNOSIS — R195 Other fecal abnormalities: Secondary | ICD-10-CM | POA: Diagnosis not present

## 2022-04-19 ENCOUNTER — Other Ambulatory Visit (HOSPITAL_COMMUNITY): Payer: Self-pay

## 2022-04-19 MED ORDER — CIPROFLOXACIN HCL 500 MG PO TABS
500.0000 mg | ORAL_TABLET | Freq: Two times a day (BID) | ORAL | 0 refills | Status: DC
  Filled 2022-04-19: qty 20, 10d supply, fill #0

## 2022-04-19 MED ORDER — METRONIDAZOLE 500 MG PO TABS
500.0000 mg | ORAL_TABLET | Freq: Three times a day (TID) | ORAL | 0 refills | Status: DC
  Filled 2022-04-19: qty 30, 10d supply, fill #0

## 2022-04-24 DIAGNOSIS — R195 Other fecal abnormalities: Secondary | ICD-10-CM | POA: Diagnosis not present

## 2022-04-25 ENCOUNTER — Ambulatory Visit (HOSPITAL_COMMUNITY)
Admission: RE | Admit: 2022-04-25 | Discharge: 2022-04-25 | Disposition: A | Discharge status: Home / Self Care | Payer: 59 | Source: Ambulatory Visit | Attending: Internal Medicine | Admitting: Internal Medicine

## 2022-04-25 ENCOUNTER — Other Ambulatory Visit (HOSPITAL_COMMUNITY): Payer: Self-pay | Admitting: Internal Medicine

## 2022-04-25 DIAGNOSIS — R195 Other fecal abnormalities: Secondary | ICD-10-CM | POA: Insufficient documentation

## 2022-04-25 DIAGNOSIS — K921 Melena: Secondary | ICD-10-CM | POA: Diagnosis not present

## 2022-04-25 MED ORDER — IOHEXOL 350 MG/ML SOLN
75.0000 mL | Freq: Once | INTRAVENOUS | Status: AC | PRN
  Administered 2022-04-25: 75 mL via INTRAVENOUS

## 2022-04-26 ENCOUNTER — Other Ambulatory Visit (HOSPITAL_COMMUNITY): Payer: Self-pay

## 2022-04-26 ENCOUNTER — Encounter: Payer: Self-pay | Admitting: Gastroenterology

## 2022-04-26 ENCOUNTER — Ambulatory Visit: Payer: 59 | Admitting: Gastroenterology

## 2022-04-26 VITALS — BP 110/70 | HR 76 | Ht 60.0 in | Wt 109.4 lb

## 2022-04-26 DIAGNOSIS — R1084 Generalized abdominal pain: Secondary | ICD-10-CM

## 2022-04-26 DIAGNOSIS — K529 Noninfective gastroenteritis and colitis, unspecified: Secondary | ICD-10-CM | POA: Diagnosis not present

## 2022-04-26 DIAGNOSIS — R140 Abdominal distension (gaseous): Secondary | ICD-10-CM

## 2022-04-26 MED ORDER — NA SULFATE-K SULFATE-MG SULF 17.5-3.13-1.6 GM/177ML PO SOLN
1.0000 | Freq: Once | ORAL | 0 refills | Status: AC
  Filled 2022-04-26: qty 354, 1d supply, fill #0

## 2022-04-26 NOTE — Progress Notes (Signed)
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 Nita Sells.  She is accompanied by her mother.   Today, she complains of diarrhea that started about 6 weeks ago. She has episodes of diarrhea that would be explosive or her stool would be pencil thin. It was somewhat formed 3 weeks ago but has been more watery in the past 3 weeks. She  had an episode of explosive diarrhea with multiple frequent bowel movements >15-20 that she describes as horrible and hindered her from working. She states that she had a visit with her PCP where she had blood work done including a negative c.difficile/stool culture and a positive heme occult stool test. She was prescribed antibiotics and finished the course with minimal or no relief.   She reports currently having several BM. On a good day, she would have 3-4 BM;  she has a BM every time she has to urinate. She denies any prior constipation and states she had bowel movement on average every other day She also reports noticing a pink tint in her stool. She states that her stool had a bad smell to it few weeks ago.     She reports avoiding diary and gluten with no changes to her symptoms. She also tried eating chicken and rice for a week without any improvement. She reports that currently,  she has not been eating normally as she would experience  abdominal discomfort and it would trigger her symptoms. She also states that she has doubled her water intake since experiencing her symptoms.   She also complains of lower abdominal discomfort and intermittent gas associated with r diarrhea.   She reports losing 5 pounds in 6 weeks unintentionally since her visit with her PCP in March.   She denies any vomiting, constipation, nausea, or rectal pain.  She is an emergency room and ICU nurse. She states she would occasionally have heavy flow menstrual cycles. She reports being slightly anemic which is normal for her. Her mother states that she loves animals and would play with them often and has several dogs.   She denies any family history of liver disease, bronchitis, colitis. She reports a family history of gluten intolerance. Her mother states she would experience more constipation than diarrhea as a child.     GI Hx: 04-25-22 CT Abdomen Pelvis w contrast   1. No acute intra-abdominal or pelvic pathology. 2. Moderate colonic stool burden. No bowel obstruction. Normal appendix.    Current Outpatient Medications:    ALPRAZolam (XANAX) 0.5 MG tablet, Take 1 tablet (0.5 mg total) by mouth 2 (two) times daily as needed, Disp: 60 tablet, Rfl: 0   ciprofloxacin (CIPRO) 500 MG tablet, Take 1 tablet (500 mg total) by  mouth every 12 (twelve) hours., Disp: 20 tablet, Rfl: 0   ibuprofen (ADVIL,MOTRIN) 200 MG tablet, Take 200 mg by mouth every 6 (six) hours as needed., Disp: , Rfl:    levocetirizine (XYZAL) 5 MG tablet, Take 1 tablet (5 mg total) by mouth at bedtime for allergies., Disp: 90 tablet, Rfl: 4   metroNIDAZOLE (FLAGYL) 500 MG tablet, Take 1 tablet (500 mg total) by mouth every 8 (eight) hours., Disp: 30 tablet, Rfl: 0   ondansetron (ZOFRAN-ODT) 4 MG disintegrating tablet, Take 1 tablet (4 mg total) on top on tongue every 8 (eight) hours., Disp: 30 tablet, Rfl: 1   vitamin C (ASCORBIC ACID) 500 MG tablet, Take 500 mg by mouth daily as  needed (immune support)., Disp: , Rfl:    Allergies as of 04/26/2022   (No Known Allergies)    Past Medical History:  Diagnosis Date   Anxiety    Headache    Ovarian cyst     Past Surgical History:  Procedure Laterality Date   BREAST BIOPSY Right    in 20's   LAPAROSCOPIC OVARIAN CYSTECTOMY N/A 06/10/2017   Procedure: LAPAROSCOPIC RIGHT OVARIAN CYSTECTOMY WITH PERITONEAL BIOPSY;  Surgeon: Marlow Baars, MD;  Location: [facility];  Service: Gynecology;  Laterality: N/A;   WISDOM TOOTH EXTRACTION      Family History  Problem Relation Age of Onset   Diabetes type II Father    Healthy Brother    Breast cancer Maternal Grandmother    Heart failure Maternal Grandmother    Heart disease Maternal Grandfather    Pancreatic cancer Paternal Grandfather    Breast cancer Paternal Aunt    Colon polyps Maternal Aunt    Colon cancer Neg Hx    Esophageal cancer Neg Hx    Rectal cancer Neg Hx    Stomach cancer Neg Hx     Social History   Socioeconomic History   Marital status: Single    Spouse name: Not on file   Number of children: 0   Years of education: Not on file   Highest education level: Not on file  Occupational History   Occupation: Nurse    Employer: [employer]  Tobacco Use   Smoking status: Never   Smokeless tobacco: Never  Vaping Use   Vaping Use: Never used  Substance and Sexual Activity   Alcohol use: Yes    Alcohol/week: 1.0 standard drink of alcohol    Types: 1 Glasses of wine per week    Comment: occasional   Drug use: No   Sexual activity: Never  Other Topics Concern   Not on file  Social History Narrative   Not on file   Social Determinants of Health   Financial Resource Strain: Not on file  Food Insecurity: Not on file  Transportation Needs: Not on file  Physical Activity: Not on file  Stress: Not on file  Social Connections: Not on file  Intimate Partner Violence: Not on file      Review of systems: Review of Systems  Constitutional:   Positive for unexpected weight change.  Gastrointestinal:  Positive for abdominal distention (gas), abdominal pain, blood in stool and diarrhea.      Physical Exam: Vitals:   04/26/22 1057  BP: 110/70  Pulse: 76   Body mass index is 21.36 kg/m.  General: well-appearing   Eyes: sclera anicteric, no redness CV: RRR, no JVD, no peripheral edema Resp: clear to auscultation bilaterally, normal RR and effort noted GI: soft, no tenderness, with  active bowel sounds. No guarding or palpable organomegaly noted. Skin; warm and dry, no rash or jaundice noted Neuro: awake, alert and oriented x 3. Normal gross motor function and fluent speech   Data Reviewed:  Reviewed labs, radiology imaging, old records and pertinent past GI work up   Assessment and Plan/Recommendations: 37 year old very pleasant female with significant unexplained diarrhea for the past 6 weeks, no improvement after short course of antibiotics, fecal Hemoccult positive and lower abdominal discomfort Will need to exclude inflammatory bowel disease [Crohn's/ulcerative colitis] The risks and benefits as well as alternatives of endoscopic procedure(s) have been discussed and reviewed. All questions answered. The patient agrees to proceed. Will request labs and stool test from PCPs office.  Further workup and management based on colonoscopy findings.   The patient was provided an opportunity to ask questions and all were answered. The patient agreed with the plan and demonstrated an understanding of the instructions.  Iona Beard , MD . CC: Benita Stabile, MD   [facility] [employer] as a scribe for Marsa Aris, MD.,have documented all relevant documentation on the behalf of Marsa Aris, MD,as directed by  Marsa Aris, MD while in the presence of Marsa Aris, MD.   I, Marsa Aris, MD, have reviewed all documentation for this visit. The documentation on 04/26/22 for the exam, diagnosis,  procedures, and orders are all accurate and complete.

## 2022-04-26 NOTE — Patient Instructions (Signed)
You have been scheduled for a colonoscopy. Please follow written instructions given to you at your visit today.  Please pick up your prep supplies at the pharmacy within the next 1-3 days. If you use inhalers (even only as needed), please bring them with you on the day of your procedure.   We have sent the following medications to your pharmacy for you to pick up at your convenience: SUPREP   Due to recent changes in healthcare laws, you may see the results of your imaging and laboratory studies on MyChart before your provider has had a chance to review them.  We understand that in some cases there may be results that are confusing or concerning to you. Not all laboratory results come back in the same time frame and the provider may be waiting for multiple results in order to interpret others.  Please give [place] 48 hours in order for your provider to thoroughly review all the results before contacting the office for clarification of your results.    I appreciate the  opportunity to care for you  Thank You   Marsa Aris , MD

## 2022-05-01 ENCOUNTER — Encounter: Payer: Self-pay | Admitting: Gastroenterology

## 2022-05-06 ENCOUNTER — Encounter: Payer: Self-pay | Admitting: Gastroenterology

## 2022-05-06 ENCOUNTER — Ambulatory Visit (AMBULATORY_SURGERY_CENTER): Payer: 59 | Admitting: Gastroenterology

## 2022-05-06 VITALS — BP 102/64 | HR 69 | Temp 99.1°F | Resp 14 | Ht 60.0 in | Wt 109.0 lb

## 2022-05-06 DIAGNOSIS — K529 Noninfective gastroenteritis and colitis, unspecified: Secondary | ICD-10-CM | POA: Diagnosis not present

## 2022-05-06 DIAGNOSIS — F419 Anxiety disorder, unspecified: Secondary | ICD-10-CM | POA: Diagnosis not present

## 2022-05-06 DIAGNOSIS — K6389 Other specified diseases of intestine: Secondary | ICD-10-CM | POA: Diagnosis not present

## 2022-05-06 DIAGNOSIS — R197 Diarrhea, unspecified: Secondary | ICD-10-CM | POA: Diagnosis not present

## 2022-05-06 DIAGNOSIS — R140 Abdominal distension (gaseous): Secondary | ICD-10-CM | POA: Diagnosis not present

## 2022-05-06 DIAGNOSIS — K602 Anal fissure, unspecified: Secondary | ICD-10-CM

## 2022-05-06 MED ORDER — AMBULATORY NON FORMULARY MEDICATION: 0.1250 "application " | Freq: Three times a day (TID) | 1 refills | Status: DC

## 2022-05-06 MED ORDER — SODIUM CHLORIDE 0.9 % IV SOLN: 500.0000 mL | Freq: Once | INTRAVENOUS | Status: DC

## 2022-05-06 NOTE — Op Note (Addendum)
[facility] Patient Name: Kathleen Wood Procedure Date: 05/06/2022 3:55 PM MRN: [id number] Endoscopist: Napoleon Form , MD, [id number] Age: 37 Referring MD:  Date of Birth: [birth year] Gender: Female Account #: [id number] Procedure:                Colonoscopy Indications:              Clinically significant diarrhea of unexplained                            origin Medicines:                Monitored Anesthesia Care Procedure:                Pre-Anesthesia Assessment:                           - Prior to the procedure, a History and Physical                            was performed, and patient medications and                            allergies were reviewed. The patient's tolerance of                            previous anesthesia was also reviewed. The risks                            and benefits of the procedure and the sedation                            options and risks were discussed with the patient.                            All questions were answered, and informed consent                            was obtained. Prior Anticoagulants: The patient has                            taken no anticoagulant or antiplatelet agents. ASA                            Grade Assessment: II - A patient with mild systemic                            disease. After reviewing the risks and benefits,                            the patient was deemed in satisfactory condition to                            undergo the procedure.  After obtaining informed consent, the colonoscope                            was passed under direct vision. Throughout the                            procedure, the patient's blood pressure, pulse, and                            oxygen saturations were monitored continuously. The                            Olympus PCF-H190DL [id number]) Colonoscope was                            introduced through the anus and advanced to the the                             terminal ileum, with identification of the                            appendiceal orifice and IC valve. The colonoscopy                            was performed without difficulty. The patient                            tolerated the procedure well. The quality of the                            bowel preparation was good. The terminal ileum,                            ileocecal valve, appendiceal orifice, and rectum                            were photographed. Scope In: 4:14:00 PM Scope Out: 4:27:18 PM Scope Withdrawal Time: 0 hours 8 minutes 52 seconds  Total Procedure Duration: 0 hours 13 minutes 18 seconds  Findings:                 The perianal and digital rectal examinations were                            normal.                           Normal mucosa was found in the entire colon.                            Biopsies for histology were taken with a cold                            forceps from the right transverse colon and left  transverse colon for evaluation of microscopic                            colitis.                           The terminal ileum appeared normal.                           Non-bleeding external and internal hemorrhoids were                            found during retroflexion. The hemorrhoids were                            small.                           A less than 5 mm anal fissure was found in the anal                            canal. Complications:            No immediate complications. Estimated Blood Loss:     Estimated blood loss was minimal. Impression:               - Normal mucosa in the entire examined colon.                            Biopsied.                           - The examined portion of the ileum was normal.                           - Non-bleeding external and internal hemorrhoids.                           - Anal fissure. Recommendation:           - Resume previous diet.                            - Continue present medications.                           - Await pathology results.                           - Repeat colonoscopy date to be determined after                            pending pathology results are reviewed for                            surveillance based on pathology results.                           -  Benefiber 1 tablespoon twice daily with meals                           - 0.125% Nitroglycerine small pea size amount per                            rectum three times daily with meals for 8 weeks                           - Check ESR, CRP, IgA and TTG IgA Ab to exclude                            other etiology if has persistent symptoms in 2 weeks Napoleon Form, MD 05/06/2022 4:39:06 PM This report has been signed electronically.

## 2022-05-06 NOTE — Progress Notes (Signed)
Called to room to assist during endoscopic procedure.  Patient ID and intended procedure confirmed with present staff. Received instructions for my participation in the procedure from the performing physician.  

## 2022-05-06 NOTE — Progress Notes (Signed)
Report to PACU, RN, vss, BBS= Clear.  

## 2022-05-06 NOTE — Patient Instructions (Addendum)
Thank you for coming in to see [place] today. Resume your diet and medications today,. Return to regular daily activities tomorrow. Biopsy results will be available in 1-2 weeks, at which time recommendations will be made if necessary.   YOU HAD AN ENDOSCOPIC PROCEDURE TODAY AT THE [facility]:   Refer to the procedure report that was given to you for any specific questions about what was found during the examination.  If the procedure report does not answer your questions, please call your gastroenterologist to clarify.  If you requested that your care partner not be given the details of your procedure findings, then the procedure report has been included in a sealed envelope for you to review at your convenience later.  YOU SHOULD EXPECT: Some feelings of bloating in the abdomen. Passage of more gas than usual.  Walking can help get rid of the air that was put into your GI tract during the procedure and reduce the bloating. If you had a lower endoscopy (such as a colonoscopy or flexible sigmoidoscopy) you may notice spotting of blood in your stool or on the toilet paper. If you underwent a bowel prep for your procedure, you may not have a normal bowel movement for a few days.  Please Note:  You might notice some irritation and congestion in your nose or some drainage.  This is from the oxygen used during your procedure.  There is no need for concern and it should clear up in a day or so.  SYMPTOMS TO REPORT IMMEDIATELY:  Following lower endoscopy (colonoscopy or flexible sigmoidoscopy):  Excessive amounts of blood in the stool  Significant tenderness or worsening of abdominal pains  Swelling of the abdomen that is new, acute  Fever of 100F or higher   For urgent or emergent issues, a gastroenterologist can be reached at any hour by calling [phone]. Do not use MyChart messaging for urgent concerns.    DIET:  We do recommend a small meal at first, but then you may proceed to  your regular diet.  Drink plenty of fluids but you should avoid alcoholic beverages for 24 hours.  ACTIVITY:  You should plan to take it easy for the rest of today and you should NOT DRIVE or use heavy machinery until tomorrow (because of the sedation medicines used during the test).    FOLLOW UP: Our staff will call the number listed on your records the next business day following your procedure.  We will call around 7:15- 8:00 am to check on you and address any questions or concerns that you may have regarding the information given to you following your procedure. If we do not reach you, we will leave a message.     If any biopsies were taken you will be contacted by phone or by letter within the next 1-3 weeks.  Please call us at [phone] if you have not heard about the biopsies in 3 weeks.    SIGNATURES/CONFIDENTIALITY: You and/or your care partner have signed paperwork which will be entered into your electronic medical record.  These signatures attest to the fact that that the information above on your After Visit Summary has been reviewed and is understood.  Full responsibility of the confidentiality of this discharge information lies with you and/or your care-partner.

## 2022-05-06 NOTE — Progress Notes (Signed)
Please refer to office visit note 04/26/22. No additional changes in H&P Patient is appropriate for planned procedure(s) and anesthesia in an ambulatory setting  K. Scherry Ran , MD [phone]

## 2022-05-07 ENCOUNTER — Telehealth: Payer: Self-pay | Admitting: *Deleted

## 2022-05-07 ENCOUNTER — Telehealth: Payer: Self-pay

## 2022-05-07 NOTE — Telephone Encounter (Signed)
Left message for pharmacy Nitro 0.125 % apply pea size TID for 8 weeks.  Left message on patient's VM that this prescription was sent to [facility] as [facility] pharmacy does not have capability to make compounded medications as per Arlys John, Pharm-D @ [facility] outpatient pharmacy.

## 2022-05-07 NOTE — Telephone Encounter (Signed)
  Follow up Call-     05/06/2022    3:25 PM  Call back number  Post procedure Call Back phone  # [phone]  Permission to leave phone message Yes     Patient questions:  Do you have a fever, pain , or abdominal swelling? No. Pain Score  0 *  Have you tolerated food without any problems? Yes.    Have you been able to return to your normal activities? Yes.    Do you have any questions about your discharge instructions: Diet   No. Medications  No. Follow up visit  No.  Do you have questions or concerns about your Care? No.  Actions: * If pain score is 4 or above: No action needed, pain <4.

## 2022-05-09 ENCOUNTER — Telehealth: Payer: Self-pay | Admitting: *Deleted

## 2022-05-09 DIAGNOSIS — R1084 Generalized abdominal pain: Secondary | ICD-10-CM

## 2022-05-09 DIAGNOSIS — R140 Abdominal distension (gaseous): Secondary | ICD-10-CM

## 2022-05-09 DIAGNOSIS — K529 Noninfective gastroenteritis and colitis, unspecified: Secondary | ICD-10-CM

## 2022-05-09 MED ORDER — AMBULATORY NON FORMULARY MEDICATION: 1 refills | Status: DC

## 2022-05-09 NOTE — Telephone Encounter (Signed)
I will mail Nitroglycerin ointment rx to the patient

## 2022-05-10 ENCOUNTER — Encounter: Payer: Self-pay | Admitting: Gastroenterology

## 2022-05-14 NOTE — Telephone Encounter (Signed)
Rx was created and sent 05/09/22 in addition to verbal rx given to [facility] via phone by Caren Griffins, RN previously.

## 2022-05-16 ENCOUNTER — Encounter: Payer: Self-pay | Admitting: Gastroenterology

## 2022-05-17 ENCOUNTER — Encounter: Payer: Self-pay | Admitting: Gastroenterology

## 2022-05-20 ENCOUNTER — Telehealth: Payer: Self-pay | Admitting: Gastroenterology

## 2022-05-20 NOTE — Telephone Encounter (Signed)
Spoke with the patient. She is continuing to have the same symptoms as she was seen for in office. She reports the diarrhea has decreased to 3 or 4 urgent stools, usually happening after a meal. Her stomach is gassy, creating increased intestinal gas which she cannot pass with confidence. She will poop when she passes gas.She has tried diet modifications without improvement. She will come in for her labs on Wednesday.  She asks your thoughts on this and also asks can the fissure cause any of these symptoms?

## 2022-05-20 NOTE — Telephone Encounter (Signed)
Inbound call from patient, states she would like to speak with a nurse in regards to her further plan of care. Patient is concerned because she is still experiencing urgency. She states she has not received any feedback on pathology results from her procedure and would also like to discuss that. Please advise.

## 2022-05-21 NOTE — Telephone Encounter (Signed)
It is possible that anal fissure is not yet healed and she is having incomplete evacuation.  Please schedule office follow-up visit soon either with me or APP to do a rectal exam to evaluate the anal fissure.  Thank you

## 2022-05-22 ENCOUNTER — Other Ambulatory Visit (INDEPENDENT_AMBULATORY_CARE_PROVIDER_SITE_OTHER): Payer: 59

## 2022-05-22 DIAGNOSIS — R1084 Generalized abdominal pain: Secondary | ICD-10-CM | POA: Diagnosis not present

## 2022-05-22 DIAGNOSIS — K529 Noninfective gastroenteritis and colitis, unspecified: Secondary | ICD-10-CM | POA: Diagnosis not present

## 2022-05-22 DIAGNOSIS — R140 Abdominal distension (gaseous): Secondary | ICD-10-CM

## 2022-05-22 LAB — SEDIMENTATION RATE: Sed Rate: 13 mm/hr (ref 0–20)

## 2022-05-22 LAB — HIGH SENSITIVITY CRP: CRP, High Sensitivity: 0.4 mg/L (ref 0.000–5.000)

## 2022-05-22 NOTE — Telephone Encounter (Signed)
Spoke with the patient. Her symptoms are unimproved. She plans to stop the probiotics she has been taking. She thinks it may be causing her to have abdominal discomfort.  No openings on the schedule. Work-in appointment for 05/27/22 scheduled.

## 2022-05-23 LAB — TISSUE TRANSGLUTAMINASE, IGA: (tTG) Ab, IgA: <1 U/mL

## 2022-05-23 LAB — IGA: Immunoglobulin A: 108 mg/dL (ref 47–310)

## 2022-05-26 ENCOUNTER — Telehealth: Payer: 59 | Admitting: Nurse Practitioner

## 2022-05-26 DIAGNOSIS — J019 Acute sinusitis, unspecified: Secondary | ICD-10-CM

## 2022-05-26 DIAGNOSIS — B9789 Other viral agents as the cause of diseases classified elsewhere: Secondary | ICD-10-CM

## 2022-05-26 NOTE — Progress Notes (Signed)
E-Visit for Sinus Problems Providers prescribe antibiotics to treat infections caused by bacteria. Antibiotics are very powerful in treating bacterial infections when they are used properly. To maintain their effectiveness, they should be used only when necessary. Overuse of antibiotics has resulted in the development of superbugs that are resistant to treatment!    After careful review of your answers, I would not recommend an antibiotic for your condition.  Antibiotics are not effective against viruses and therefore should not be used to treat them. Common examples of infections caused by viruses include colds and flu. If your symptoms do not improve by next Thursday or Friday please feel free to reach out to [place] for further treatment.  I recommend you continue with your current treatment until then.   We are sorry that you are not feeling well.  Here is how we plan to help!  Based on what you have shared with me it looks like you have sinusitis.  Sinusitis is inflammation and infection in the sinus cavities of the head.  Based on your presentation I believe you most likely have Acute Viral Sinusitis.This is an infection most likely caused by a virus. There is not specific treatment for viral sinusitis other than to help you with the symptoms until the infection runs its course.  You may use an oral decongestant such as Mucinex D or if you have glaucoma or high blood pressure use plain Mucinex. Saline nasal spray help and can safely be used as often as needed for congestion   Some authorities believe that zinc sprays or the use of Echinacea may shorten the course of your symptoms.  Sinus infections are not as easily transmitted as other respiratory infection, however we still recommend that you avoid close contact with loved ones, especially the very young and elderly.  Remember to wash your hands thoroughly throughout the day as this is the number one way to prevent the spread of infection!  Home  Care: Only take medications as instructed by your medical team. Do not take these medications with alcohol. A steam or ultrasonic humidifier can help congestion.  You can place a towel over your head and breathe in the steam from hot water coming from a faucet. Avoid close contacts especially the very young and the elderly. Cover your mouth when you cough or sneeze. Always remember to wash your hands.  Get Help Right Away If: You develop worsening fever or sinus pain. You develop a severe head ache or visual changes. Your symptoms persist after you have completed your treatment plan.  Make sure you Understand these instructions. Will watch your condition. Will get help right away if you are not doing well or get worse.   Thank you for choosing an e-visit.  Your e-visit answers were reviewed by a board certified advanced clinical practitioner to complete your personal care plan. Depending upon the condition, your plan could have included both over the counter or prescription medications.  Please review your pharmacy choice. Make sure the pharmacy is open so you can pick up prescription now. If there is a problem, you may contact your provider through [employer] and have the prescription routed to another pharmacy.  Your safety is important to [place]. If you have drug allergies check your prescription carefully.   For the next 24 hours you can use MyChart to ask questions about today's visit, request a non-urgent call back, or ask for a work or school excuse. You will get an email in the next  two days asking about your experience. I hope that your e-visit has been valuable and will speed your recovery.

## 2022-05-26 NOTE — Progress Notes (Signed)
I have spent 5 minutes in review of e-visit questionnaire, review and updating patient chart, medical decision making and response to patient.  ° °Taheerah Guldin W Alvis Pulcini, NP ° °  °

## 2022-05-27 ENCOUNTER — Other Ambulatory Visit (HOSPITAL_COMMUNITY): Payer: Self-pay

## 2022-05-27 ENCOUNTER — Ambulatory Visit: Payer: 59 | Admitting: Physician Assistant

## 2022-05-27 ENCOUNTER — Encounter: Payer: Self-pay | Admitting: Physician Assistant

## 2022-05-27 DIAGNOSIS — R109 Unspecified abdominal pain: Secondary | ICD-10-CM | POA: Diagnosis not present

## 2022-05-27 DIAGNOSIS — R197 Diarrhea, unspecified: Secondary | ICD-10-CM | POA: Diagnosis not present

## 2022-05-27 DIAGNOSIS — R634 Abnormal weight loss: Secondary | ICD-10-CM

## 2022-05-27 DIAGNOSIS — R152 Fecal urgency: Secondary | ICD-10-CM | POA: Diagnosis not present

## 2022-05-27 MED ORDER — DICYCLOMINE HCL 10 MG PO CAPS
10.0000 mg | ORAL_CAPSULE | Freq: Four times a day (QID) | ORAL | 0 refills | Status: DC
  Filled 2022-05-27: qty 60, 15d supply, fill #0

## 2022-05-27 NOTE — Patient Instructions (Signed)
_______________________________________________________  If your blood pressure at your visit was 140/90 or greater, please contact your primary care physician to follow up on this.  If you are age 36 or younger, your body mass index should be between 19-25. Your Body mass index is 20.66 kg/m. If this is out of the aformentioned range listed, please consider follow up with your Primary Care Provider.  ________________________________________________________  The [facility] providers would like to encourage you to use [facility] to communicate with providers for non-urgent requests or questions.  Due to long hold times on the telephone, sending your provider a message by [facility] may be a faster and more efficient way to get a response.  Please allow 48 business hours for a response.  Please remember that this is for non-urgent requests.  _______________________________________________________  Your provider has requested that you go to the basement level for lab work before leaving today. Press "B" on the elevator. The lab is located at the first door on the left as you exit the elevator.  Due to recent changes in healthcare laws, you may see the results of your imaging and laboratory studies on MyChart before your provider has had a chance to review them.  We understand that in some cases there may be results that are confusing or concerning to you. Not all laboratory results come back in the same time frame and the provider may be waiting for multiple results in order to interpret others.  Please give [place] 48 hours in order for your provider to thoroughly review all the results before contacting the office for clarification of your results.   You can use Imodium as needed.  You can take up to 6 per day.  We have sent the following medications to your pharmacy for you to pick up at your convenience:  START: Bentyl 10mg  one capsule every 6 to 8 hours as needed for diarrhea and urgency.  START:  Metamucil daily.  CONTINUE: Nitroglycerin cream/gel two times daily.  Thank you for entrusting me with your care and choosing [facility].  Amy Esterwood, PA-C

## 2022-05-27 NOTE — Progress Notes (Signed)
Subjective:    Patient ID: Kathleen Wood, female    DOB: 1985/03/19, 37 y.o.   MRN: [id number]  HPI  Kathleen Wood is a pleasant 37 year old white female, recently established with Dr. Lavon Paganini, who comes in today for follow-up and with persistent symptoms. She had been seen in April 2024 initially with complaint of diarrhea which had been present for about 6 weeks.  This has been associated with some abdominal crampy discomfort in the lower abdomen increased gassiness and weight loss of about 5 pounds. She had undergone stool cultures per her PCP Dr. Margo Aye which included stool for C. difficile and was negative.  She was Hemoccult positive.  She was given an empiric course of Cipro and Flagyl x 10 days with minimal or no improvement in symptoms. At last visit she was having several bowel movements per day with urgency and sometimes explosive stool.  She has since undergone colonoscopy which was negative including random biopsies which were unremarkable.  She was found to have a less than 5 mm anal fissure at the time of colonoscopy for which she has been on nitroglycerin ointment 2-3 times daily. She had called back with persistent symptoms.  She does feel that the fissure is healing and she is not having any ongoing constant rectal pain and no rectal bleeding. Overall the diarrhea has lessened however within the past 3 to 4 days she had another explosive episode of diarrhea and says she was in the bathroom for at least 30 minutes with ongoing diarrhea. Yesterday she had urgent loose stool.  She says sometimes with the episode she just passes a small amount of stool.  She continues to have a lot of gassiness and bloating which she feels causes a pressure sensation in her abdomen. She has stopped consuming dairy for the most part thinks that may have helped a little bit but again symptoms are persisting. She is getting ready to go on vacation to [place] tomorrow.   CT of the abdomen and pelvis April 2024  showed moderate formed stool in the colon otherwise negative. CRP 0.4/TTG and IgA negative.  Patient relates recent TSH had been normal  Review of Systems Pertinent positive and negative review of systems were noted in the above HPI section.  All other review of systems was otherwise negative.   Outpatient Encounter Medications as of 05/27/2022  Medication Sig   ALPRAZolam (XANAX) 0.5 MG tablet Take 1 tablet (0.5 mg total) by mouth 2 (two) times daily as needed   AMBULATORY NON FORMULARY MEDICATION Medication Name: se small pea sized amount per rectum three times a day with meals for 8 weeks   dicyclomine (BENTYL) 10 MG capsule Take 1 capsule (10 mg total) by mouth every 6-8 hours as needed for diarrhea and urgency   ibuprofen (ADVIL,MOTRIN) 200 MG tablet Take 200 mg by mouth every 6 (six) hours as needed.   levocetirizine (XYZAL) 5 MG tablet Take 1 tablet (5 mg total) by mouth at bedtime for allergies.   ondansetron (ZOFRAN-ODT) 4 MG disintegrating tablet Take 1 tablet (4 mg total) on top on tongue every 8 (eight) hours.   vitamin C (ASCORBIC ACID) 500 MG tablet Take 500 mg by mouth daily as needed (immune support).   No facility-administered encounter medications on file as of 05/27/2022.   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   RLS (restless legs syndrome) 09/22/2017   Hemorrhagic cyst of right  ovary 11/04/2016   Social History   Socioeconomic History   Marital status: Single    Spouse name: Not on file   Number of children: 0   Years of education: Not on file   Highest education level: Not on file  Occupational History   Occupation: Nurse    Employer: [employer]  Tobacco Use   Smoking status: Never   Smokeless tobacco: Never  Vaping Use   Vaping Use: Never used  Substance and Sexual Activity   Alcohol use: Yes    Alcohol/week: 1.0 standard drink of alcohol    Types: 1 Glasses of  wine per week    Comment: occasional   Drug use: No   Sexual activity: Never  Other Topics Concern   Not on file  Social History Narrative   Not on file   Social Determinants of Health   Financial Resource Strain: Not on file  Food Insecurity: Not on file  Transportation Needs: Not on file  Physical Activity: Not on file  Stress: Not on file  Social Connections: Not on file  Intimate Partner Violence: Not on file    Kathleen Wood's family history includes Breast cancer in her maternal grandmother and paternal aunt; Colon polyps in her maternal aunt; Diabetes type II in her father; Healthy in her brother; Heart disease in her maternal grandfather; Heart failure in her maternal grandmother; Pancreatic cancer in her paternal grandfather.      Objective:    Vitals:   05/27/22 1339  BP: (!) 90/56  Pulse: 73    Physical Exam Well-developed well-nourished young white female in no acute distress.  Pleasant height, Weight, 105 BMI 20.6  HEENT; nontraumatic normocephalic, EOMI, PE R LA, sclera anicteric. Oropharynx; not examined today Neck; supple, no JVD Cardiovascular; regular rate and rhythm with S1-S2, no murmur rub or gallop Pulmonary; Clear bilaterally Abdomen; soft, no focal tenderness nondistended, no palpable mass or hepatosplenomegaly, bowel sounds are active Rectal; I did not do  anoscopy-quite tender with rectal swab Skin; benign exam, no jaundice rash or appreciable lesions Extremities; no clubbing cyanosis or edema skin warm and dry Neuro/Psych; alert and oriented x4, grossly nonfocal mood and affect appropriate        Assessment & Plan:   #44 37 year old white female with 8 to 9-week history of altered bowel habits with diarrhea initially severe, now not as severe but persisting with episodes of urgency and explosive diarrhea on some days alternating with other days of urgent loose stool.  Continued gassiness and bloating. She has also had about a 10 pound weight  loss overall since onset of symptoms  Etiology of current illness is not clear.  Negative colonoscopy and negative random biopsies. Negative TTG and IgA Negative stool culture and stool for C. difficile by PCP prior to GI evaluation CT of the abdomen and pelvis April 2024 with moderate formed stool in the colon otherwise negative  Consider persistent infectious etiology question giardiasis versus other parasitic infection. Rule out SIBO, rule out pancreatic insufficiency  #2 small anal fissure symptomatically improving with nitroglycerin ointment  Plan; [facility] GI path panel done today to include C. difficile and Giardia testing Fecal calprotectin, fecal elastase Start Bentyl 10 mg p.o. twice daily to 3 times daily as needed, prescription sent We discussed using Imodium on an as-needed basis for travel. Push hydration. If above stool testing negative and symptoms are persisting when she returns from [place] consider SIBO testing or empiric course of Xifaxan  Nalina Yeatman S [name] PA-C  05/27/2022   Cc: Benita Stabile, MD

## 2022-05-30 ENCOUNTER — Ambulatory Visit: Payer: 59 | Admitting: Internal Medicine

## 2022-06-04 ENCOUNTER — Telehealth: Payer: Self-pay | Admitting: *Deleted

## 2022-06-04 NOTE — Telephone Encounter (Signed)
-----   Message from Sammuel Cooper, PA-C sent at 05/31/2022  5:27 PM EDT ----- Regarding: stool panel Please call pt and let her know the [facility] GI path panel is negative  I will send report to be scanned in to Epic  Convert this to a phone note   Thanks

## 2022-06-04 NOTE — Telephone Encounter (Signed)
I have left a voicemail for patient to call back. 

## 2022-06-06 NOTE — Telephone Encounter (Signed)
Attempted to reach patient via phone number provided. Unfortunately, this goes directly to voicemail that is "full". Will send result note in mychart message for patient instead.

## 2022-06-12 NOTE — Telephone Encounter (Signed)
Patient is calling states she is out of the country so she is not able to use her phone. Says she comes back on Friday and will be able to return a MyChart message and phone call. Also states she is having same symptoms and if you are wanting to schedule her an appointment that is fine. Please advise

## 2022-06-12 NOTE — Telephone Encounter (Signed)
Noted. Will be glad to discuss any concerns when she is available to discuss.

## 2022-06-13 ENCOUNTER — Encounter: Payer: Self-pay | Admitting: Physician Assistant

## 2022-09-16 ENCOUNTER — Ambulatory Visit
Admission: RE | Admit: 2022-09-16 | Discharge: 2022-09-16 | Disposition: A | Discharge status: Home / Self Care | Payer: 59 | Source: Ambulatory Visit | Attending: Obstetrics | Admitting: Obstetrics

## 2022-09-16 ENCOUNTER — Ambulatory Visit
Admission: RE | Admit: 2022-09-16 | Discharge: 2022-09-16 | Disposition: A | Discharge status: Home / Self Care | Payer: 59 | Source: Ambulatory Visit | Attending: Obstetrics

## 2022-09-16 ENCOUNTER — Other Ambulatory Visit: Payer: Self-pay | Admitting: Obstetrics

## 2022-09-16 DIAGNOSIS — D241 Benign neoplasm of right breast: Secondary | ICD-10-CM

## 2022-09-16 DIAGNOSIS — N6325 Unspecified lump in the left breast, overlapping quadrants: Secondary | ICD-10-CM | POA: Diagnosis not present

## 2022-09-16 DIAGNOSIS — N6320 Unspecified lump in the left breast, unspecified quadrant: Secondary | ICD-10-CM

## 2022-09-16 DIAGNOSIS — N6311 Unspecified lump in the right breast, upper outer quadrant: Secondary | ICD-10-CM | POA: Diagnosis not present

## 2022-09-17 ENCOUNTER — Ambulatory Visit
Admission: RE | Admit: 2022-09-17 | Discharge: 2022-09-17 | Disposition: A | Discharge status: Home / Self Care | Payer: 59 | Source: Ambulatory Visit | Attending: Obstetrics | Admitting: Obstetrics

## 2022-09-17 DIAGNOSIS — N6320 Unspecified lump in the left breast, unspecified quadrant: Secondary | ICD-10-CM

## 2022-09-17 DIAGNOSIS — N6325 Unspecified lump in the left breast, overlapping quadrants: Secondary | ICD-10-CM | POA: Diagnosis not present

## 2022-09-17 HISTORY — PX: BREAST BIOPSY: SHX20

## 2022-12-09 ENCOUNTER — Other Ambulatory Visit (HOSPITAL_COMMUNITY): Payer: Self-pay

## 2023-02-24 ENCOUNTER — Other Ambulatory Visit (HOSPITAL_COMMUNITY): Payer: Self-pay

## 2023-02-24 DIAGNOSIS — R197 Diarrhea, unspecified: Secondary | ICD-10-CM | POA: Diagnosis not present

## 2023-02-24 DIAGNOSIS — R7301 Impaired fasting glucose: Secondary | ICD-10-CM | POA: Diagnosis not present

## 2023-02-24 DIAGNOSIS — Z0000 Encounter for general adult medical examination without abnormal findings: Secondary | ICD-10-CM | POA: Diagnosis not present

## 2023-02-24 MED ORDER — LEVOCETIRIZINE DIHYDROCHLORIDE 5 MG PO TABS
5.0000 mg | ORAL_TABLET | Freq: Every day | ORAL | 2 refills | Status: AC
  Filled 2023-02-24: qty 30, 30d supply, fill #0

## 2023-02-24 MED ORDER — ALPRAZOLAM 0.5 MG PO TABS
0.5000 mg | ORAL_TABLET | Freq: Two times a day (BID) | ORAL | 0 refills | Status: AC | PRN
  Filled 2023-02-24: qty 60, 30d supply, fill #0

## 2023-02-24 MED ORDER — ONDANSETRON 4 MG PO TBDP
4.0000 mg | ORAL_TABLET | Freq: Three times a day (TID) | ORAL | 1 refills | Status: AC
  Filled 2023-02-24: qty 30, 10d supply, fill #0

## 2023-03-05 ENCOUNTER — Ambulatory Visit (INDEPENDENT_AMBULATORY_CARE_PROVIDER_SITE_OTHER): Payer: 59 | Admitting: Gastroenterology

## 2023-03-05 ENCOUNTER — Encounter: Payer: Self-pay | Admitting: Gastroenterology

## 2023-03-05 VITALS — BP 110/70 | HR 81 | Ht 60.0 in | Wt 104.0 lb

## 2023-03-05 DIAGNOSIS — K5902 Outlet dysfunction constipation: Secondary | ICD-10-CM

## 2023-03-05 DIAGNOSIS — R748 Abnormal levels of other serum enzymes: Secondary | ICD-10-CM

## 2023-03-05 DIAGNOSIS — M6289 Other specified disorders of muscle: Secondary | ICD-10-CM | POA: Diagnosis not present

## 2023-03-05 DIAGNOSIS — R194 Change in bowel habit: Secondary | ICD-10-CM

## 2023-03-05 NOTE — Patient Instructions (Addendum)
 VISIT SUMMARY:  During your visit, we discussed your ongoing gastrointestinal issues, concerns about elevated amylase levels, and prediabetes. We reviewed your symptoms, family history, and current treatments, and developed a plan to address each of your concerns.  YOUR PLAN:  -ANAL SPHINCTER DYSFUNCTION: Anal sphincter dysfunction is when the muscles around the anus do not work properly, causing issues like incomplete bowel movements and ribbon-like stools. We recommend pelvic floor physical therapy to help retrain these muscles, continuing a high-fiber diet, and increasing your water intake.  -SMALL INTESTINAL BACTERIAL OVERGROWTH (SIBO): SIBO occurs when there is an abnormal increase in the number of bacteria in the small intestine, leading to symptoms like bloating and gas. We discussed the possibility of a two-week course of Xifaxan if your symptoms persist, advised against using probiotics, and suggested incorporating fermented foods in moderation.  -ELEVATED AMYLASE: Elevated amylase can be due to various reasons, including genetic factors or intestinal issues. Given your normal CT scan and family history, we have a low suspicion for pancreatic cancer. We will recheck your amylase levels in three weeks and consider further tests if needed.  -PREDIABETES: Prediabetes is when your blood sugar levels are higher than normal but not high enough to be classified as diabetes. Your A1c level of 5.7% indicates prediabetes. We discussed monitoring your glucose levels, possibly with a continuous glucose monitor, and maintaining a healthy diet.  -GENERAL HEALTH MAINTENANCE: We emphasized the importance of a balanced diet rich in fruits, vegetables, and high-fiber foods, while avoiding high-sugar and processed foods.  INSTRUCTIONS:  Please follow up in six months. If your symptoms persist or worsen, we may consider further testing. Recheck your amylase levels in three weeks, and discuss continuous  glucose monitoring with your primary care physician.  I appreciate the  opportunity to care for you  Thank You   Marsa Aris , MD

## 2023-03-05 NOTE — Progress Notes (Unsigned)
 Kathleen Wood    [id number]    1985/08/16  Primary Care Physician:Hall, Kathleene Hazel, MD  Referring Physician: Benita Stabile, MD [address] [place],  [place] [address]   Chief complaint: Elevated amylase, change in bowel habits, abdominal bloating, diarrhea  Discussed the use of AI scribe software for clinical note transcription with the patient, who gave verbal consent to proceed.  History of Present Illness   Kathleen Wood is a 38 year old female who presents with concerns about bowel habits and elevated amylase levels.  She has been experiencing gastrointestinal issues since last April, characterized by changes in bowel habits, including 'ribbon-like' stools and a sensation of incomplete evacuation. She also experiences lower pelvic pain described as a burning sensation, bloating, and gas. Despite dietary changes, such as eliminating dairy and fast food, these symptoms persist. She has been taking fiber supplements, specifically Benefiber, twice daily to manage her symptoms.  She has a history of an anal fissure, which she believes has resolved, but notes that her anal sphincter does not relax fully, contributing to her symptoms of incomplete evacuation and ribbon-like stools.  She is concerned about elevated amylase levels and a recent A1c of 5.7, with a previous high glucose level of 200 after eating. Her family history includes pancreatic cancer, with her paternal grandfather and uncle having died from the disease, and her father is diabetic, raising concerns about potential genetic predispositions.  She has experienced difficulty gaining weight after losing it during her gastrointestinal issues. She has been taking probiotics intermittently but has been advised against them due to potential bacterial overgrowth. She is considering the possibility of small intestinal bacterial overgrowth (SIBO) as a cause of her symptoms.  No significant changes in bowel movement  frequency, noting about one bowel movement per day before a recent gastrointestinal bug.      Colonoscopy 05/06/22 - Normal mucosa in the entire examined colon. Biopsied. - The examined portion of the ileum was normal. - Non- bleeding external and internal hemorrhoids. - Anal fissure.  1. Surgical [P], right sides colon bx - COLONIC MUCOSA WITH NO SIGNIFICANT PATHOLOGY. 2. Surgical [P], left sided colon bx - COLONIC MUCOSA WITH MILD LAMINA PROPRIA EDEMA, NON-SPECIFIC, OTHERWISE NO SIGNIFICANT PATHOLOGY.  Outpatient Encounter Medications as of 03/05/2023  Medication Sig   ALPRAZolam (XANAX) 0.5 MG tablet Take 1 tablet (0.5 mg total) by mouth 2 (two) times daily as needed.   ibuprofen (ADVIL,MOTRIN) 200 MG tablet Take 200 mg by mouth every 6 (six) hours as needed.   levocetirizine (XYZAL) 5 MG tablet Take 1 tablet (5 mg total) by mouth at bedtime for allergies (Patient taking differently: Take 5 mg by mouth daily as needed for allergies.)   ondansetron (ZOFRAN-ODT) 4 MG disintegrating tablet Dissolve 1 tablet (4 mg total) on the tongue every 8 (eight) hours. (Patient taking differently: Take 4 mg by mouth every 8 (eight) hours as needed.)   vitamin C (ASCORBIC ACID) 500 MG tablet Take 500 mg by mouth daily as needed (immune support).   [DISCONTINUED] ALPRAZolam (XANAX) 0.5 MG tablet Take 1 tablet (0.5 mg total) by mouth 2 (two) times daily as needed   [DISCONTINUED] AMBULATORY NON FORMULARY MEDICATION Medication Name: se small pea sized amount per rectum three times a day with meals for 8 weeks   [DISCONTINUED] dicyclomine (BENTYL) 10 MG capsule Take 1 capsule (10 mg total) by mouth every 6-8 hours as needed for diarrhea  and urgency   [DISCONTINUED] levocetirizine (XYZAL) 5 MG tablet Take 1 tablet (5 mg total) by mouth at bedtime for allergies. (Patient taking differently: Take 5 mg by mouth daily as needed for allergies.)   No facility-administered encounter medications on file as of 03/05/2023.     Allergies as of 03/05/2023   (No Known Allergies)    Past Medical History:  Diagnosis Date   Anxiety    Headache    Ovarian cyst    Prediabetes     Past Surgical History:  Procedure Laterality Date   BREAST BIOPSY Right    in 20's   BREAST BIOPSY Left 09/17/2022   [place] LT BREAST BX W LOC DEV 1ST LESION IMG BX SPEC US GUIDE 09/17/2022 [facility]   LAPAROSCOPIC OVARIAN CYSTECTOMY N/A 06/10/2017   Procedure: LAPAROSCOPIC RIGHT OVARIAN CYSTECTOMY WITH PERITONEAL BIOPSY;  Surgeon: Marlow Baars, MD;  Location: [facility];  Service: Gynecology;  Laterality: N/A;   WISDOM TOOTH EXTRACTION      Family History  Problem Relation Age of Onset   Diabetes type II Father    Healthy Brother    Breast cancer Maternal Grandmother    Heart failure Maternal Grandmother    Heart disease Maternal Grandfather    Pancreatic cancer Paternal Grandfather    Breast cancer Paternal Aunt    Colon polyps Maternal Aunt    Colon cancer Neg Hx    Esophageal cancer Neg Hx    Rectal cancer Neg Hx    Stomach cancer Neg Hx     Social History   Socioeconomic History   Marital status: Single    Spouse name: Not on file   Number of children: 0   Years of education: Not on file   Highest education level: Not on file  Occupational History   Occupation: Nurse    Employer: [employer]  Tobacco Use   Smoking status: Never   Smokeless tobacco: Never  Vaping Use   Vaping status: Never Used  Substance and Sexual Activity   Alcohol use: Yes    Alcohol/week: 1.0 standard drink of alcohol    Types: 1 Glasses of wine per week    Comment: occasional   Drug use: Never   Sexual activity: Not Currently  Other Topics Concern   Not on file  Social History Narrative   Not on file   Social Drivers of Health   Financial Resource Strain: Not on file  Food Insecurity: Not on file  Transportation Needs: Not on file  Physical Activity: Not on file  Stress: Not on file  Social Connections: Not on file   Intimate Partner Violence: Not on file      Review of systems: All other review of systems negative except as mentioned in the HPI.   Physical Exam: Vitals:   03/05/23 1519  BP: 110/70  Pulse: 81   Body mass index is 20.31 kg/m. Gen:      No acute distress HEENT:  sclera anicteric CV: s1s2 rrr, no murmur Lungs: B/l clear. 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  Symptoms are  consistent with dyssynergic defecation.  Symptoms include lower pelvic pain, bloating, and burning sensation. Discussed biofeedback and pelvic floor physical therapy to improve anal sphincter function.. - Refer to pelvic floor physical therapy for biofeedback - Continue high fiber diet - Increase water intake  Small Intestinal Bacterial Overgrowth (SIBO) Symptoms of bloating, gas, and discomfort suggestive of SIBO. Discussed Xifaxan treatment benefits and risks, advising against probiotics due to potential bacterial overgrowth. Explained Xifaxan's efficacy and tolerability. - Consider two-week course of Xifaxan if symptoms persist - Avoid probiotics - Incorporate fermented foods in moderation  Elevated Amylase Elevated amylase 105 with normal lipase and liver function tests. Differential includes possible lab error, genetic hyperamylasemia, intestinal issues, or less likely, pancreatic pathology. Low suspicion for pancreatic cancer given normal CT scan even though she has family history of pancreatic cancer in her uncle and grandfather. Plan to recheck amylase and consider autoimmune pancreatitis workup if  levels remain elevated. - Recheck amylase in 3 weeks - Order ANA and IgG4 for autoimmune pancreatitis workup if amylase remains elevated, will consider CT pancreas protocol if ANA and IgG4 positive for autoimmune pancreatitis  Prediabetes Hemoglobin A1c of 5.7% indicating prediabetes. Discussed glucose spikes despite normal fasting glucose and potential impact of high cortisol levels due to stress. Consider continuous glucose monitoring to identify patterns. - Discuss continuous glucose monitoring with primary care physician - Monitor dietary intake and continue healthy eating habits  General Health Maintenance Emphasized balanced diet with fruits, vegetables, and high-fiber foods. Advised against high-sugar and processed foods. - Continue high-fiber diet - Avoid high-sugar and processed foods - Incorporate a variety of colorful fruits and vegetables  Follow-up - Follow up in six months - Consider further testing if symptoms persist or worsen.      The patient was provided an opportunity to ask questions and all were answered. The patient agreed with the plan and demonstrated an understanding of the instructions.  Iona Beard , MD    CC: Benita Stabile, MD

## 2023-03-06 ENCOUNTER — Encounter: Payer: Self-pay | Admitting: Gastroenterology

## 2023-03-06 ENCOUNTER — Encounter: Payer: Self-pay | Admitting: *Deleted

## 2023-03-06 ENCOUNTER — Other Ambulatory Visit: Payer: Self-pay | Admitting: *Deleted

## 2023-03-06 DIAGNOSIS — K5902 Outlet dysfunction constipation: Secondary | ICD-10-CM

## 2023-03-06 DIAGNOSIS — M6289 Other specified disorders of muscle: Secondary | ICD-10-CM

## 2023-03-13 ENCOUNTER — Other Ambulatory Visit: Payer: Self-pay | Admitting: Obstetrics

## 2023-03-13 DIAGNOSIS — N6310 Unspecified lump in the right breast, unspecified quadrant: Secondary | ICD-10-CM

## 2023-03-17 NOTE — Therapy (Addendum)
 OUTPATIENT PHYSICAL THERAPY FEMALE PELVIC EVALUATION   Patient Name: Kathleen Wood MRN: [id number] DOB:October 12, 1985, 38 y.o., female Today's Date: 03/18/2023  END OF SESSION:  PT End of Session - 03/18/23 1504     Visit Number 1    Date for PT Re-Evaluation 09/18/23    Authorization Type [facility] [employer]    PT Start Time 1500    PT Stop Time 1545    PT Time Calculation (min) 45 min    Activity Tolerance Patient tolerated treatment well    Behavior During Therapy [facility] for tasks assessed/performed             Past Medical History:  Diagnosis Date   Anxiety    Headache    Ovarian cyst    Prediabetes    Past Surgical History:  Procedure Laterality Date   BREAST BIOPSY Right    in 20's   BREAST BIOPSY Left 09/17/2022   US  LT BREAST BX W LOC DEV 1ST LESION IMG BX SPEC US  GUIDE 09/17/2022 [facility]   LAPAROSCOPIC OVARIAN CYSTECTOMY N/A 06/10/2017   Procedure: LAPAROSCOPIC RIGHT OVARIAN CYSTECTOMY WITH PERITONEAL BIOPSY;  Surgeon: Gretta Gums, MD;  Location: [facility];  Service: Gynecology;  Laterality: N/A;   WISDOM TOOTH EXTRACTION     Patient Active Problem List   Diagnosis Date Noted   Lateral femoral cutaneous neuropathy, right 09/22/2017   RLS (restless legs syndrome) 09/22/2017   Hemorrhagic cyst of right ovary 11/04/2016    PCP: Shona Norleen PEDLAR  REFERRING PROVIDER: Shila Gustav GAILS, MD   REFERRING DIAG:  M62.89 (ICD-10-CM) - Pelvic floor dysfunction in female  K21.02 (ICD-10-CM) - Dyssynergic defecation    THERAPY DIAG:  Other lack of coordination - Plan: PT plan of care cert/re-cert  Cramp and spasm - Plan: PT plan of care cert/re-cert  Rationale for Evaluation and Treatment: Rehabilitation  ONSET DATE: 03/18/22  SUBJECTIVE:                                                                                                                                                                                           SUBJECTIVE STATEMENT: History of anal  fissures when she had the colonoscopy. . Anal area does not relax. Patient has had terrible GI issues for the past year. Patient was having urgency with stool last year. Patient was having pencil thin stool.  Fluid intake:   PAIN:  Are you having pain? Yes NPRS scale: 2/10 Pain location: pelvic area  Pain type: cramping Pain description: intermittent   Aggravating factors: random, eat gassy food, on her cycle Relieving factors: random  PRECAUTIONS: None  RED FLAGS: None  WEIGHT BEARING RESTRICTIONS: No  FALLS:  Has patient fallen in last 6 months? 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  ACTIVITY LEVEL : walking  PLOF: Independent  PATIENT GOALS: reduce tightness   PERTINENT HISTORY:  Laparoscopic  right ovarian cystectomy Sexual abuse: No  BOWEL MOVEMENT: Pain with bowel movement: Yes, pain level 2/10, uncomfortable Type of bowel movement:Type (Bristol Stool Scale) Type 6, Frequency 2 per day, Strain no, and Splinting none Fully empty rectum: No, not sure and feels like she has to go more often Leakage: No, not recently but in the past she did , when she has gas sometimes she is not sure if it is stool Pads: No Fiber supplement/laxative Yes , Benefiber in the morning  URINATION:  Pain with urination: No Fully empty bladder: No Stream: Strong Urgency: will urinate more often due to the discomfort in the pelvis and will decrease the pressure Frequency: average Leakage: none Pads: No  INTERCOURSE: not active     OBJECTIVE:  Note: Objective measures were completed at Evaluation unless otherwise noted.  DIAGNOSTIC FINDINGS:  none   COGNITION: Overall cognitive status: Within functional limits for tasks assessed     SENSATION: Light touch: Appears intact    POSTURE: No Significant postural limitations   LUMBARAROM/PROM: Lumbar ROM    LOWER EXTREMITY MNF:apojuzmjo is ROM is full    LOWER EXTREMITY MMT:  MMT Right eval Left eval  Hip extension  4/5 5/5  Hip abduction 3/5 4/5   (Blank rows = not tested) PALPATION:                 External Perineal Exam: intact                             Internal Pelvic Floor: no tenderness just increased tightness of the pelvic floor  Patient confirms identification and approves PT to assess internal pelvic floor and treatment Yes  PELVIC MMT:   MMT eval  Vaginal 4/5  Internal Anal Sphincter 4/5  External Anal Sphincter 4/5  Puborectalis 4/5  Diastasis Recti none  (Blank rows = not tested)        TONE: Increased  PROLAPSE: none  TODAY'S TREATMENT:                                                                                                                              DATE: 03/18/23  EVAL see below   PATIENT EDUCATION:  Education details: Access Code: [email or web address], education on pooping, education on dry needling Person educated: Patient Education method: Explanation, Demonstration, Tactile cues, Verbal cues, and Handouts Education comprehension: verbalized understanding, returned demonstration, verbal cues required, tactile cues required, and needs further education  HOME EXERCISE PROGRAM: 03/18/23 Access Code: [email or web address] URL: [email or web address] Date: 03/18/2023 Prepared by: Channing Pereyra  Program Notes sit on foam roll and massage the pelvic floor  Exercises - Seated Diaphragmatic Breathing  - 1 x daily -  7 x weekly - 1 sets - 10 reps  ASSESSMENT:  CLINICAL IMPRESSION: Patient is a 38 y.o. female who was seen today for physical therapy evaluation and treatment for pelvic floor dysfunction. Patient reports she has had GI issues for 1 year. She will have Type 6 bowel movements. She reports her pelvic pain that is random is 2/10 and rectal pain with bowel movements can be 2/20. She may not pass gas but stool instead. She is keeping her pelvic floor tense and has to think about her muscles relaxing.  Her pelvic floor strength is 4/5. She has increased tension in  the levator ani and obturator internist. She has weakness in her hips. Patient will benefit from skilled therapy to improve pelvic floor lengthening and coordination to reduce pain and stool leakage.   OBJECTIVE IMPAIRMENTS: decreased coordination, increased fascial restrictions, increased muscle spasms, and pain.   ACTIVITY LIMITATIONS: continence and toileting  PARTICIPATION LIMITATIONS: shopping, community activity, and occupation  PERSONAL FACTORS: Time since onset of injury/illness/exacerbation are also affecting patient's functional outcome.   REHAB POTENTIAL: Excellent  CLINICAL DECISION MAKING: Evolving/moderate complexity  EVALUATION COMPLEXITY: Moderate   GOALS: Goals reviewed with patient? Yes  SHORT TERM GOALS: Target date: 04/14/23  Patient independent with diaphragmatic breathing to elongate the pelvic floor.  Baseline: Goal status: INITIAL  2.  Patient educated on correct ways to have a bowel movement to lengthen the pelvic floor and generate enough pressure to push the stool out.  Baseline:  Goal status: INITIAL  3.  Patient educated on ways to massage the pelvic floor to lengthen the muscles.  Baseline:  Goal status: INITIAL  4.  Patient educated on ways to perform manual work to the  abdomen to reduce pain and improve bowel movements.  Baseline:  Goal status: INITIAL   LONG TERM GOALS: Target date: 09/18/23  Patient independent with advanced HEP.  Baseline:  Goal status: INITIAL  2.  Patient reports her pain with bowel movements has decreased >/= 80% due to lengthening of the pelvic floor.  Baseline:  Goal status: INITIAL  3.  Patient reports her general pelvic pain decreased >/= 80% due to reduction of tightness in the pelvis floor.  Baseline:  Goal status: INITIAL  4.  Patient reports no stool leakage for 4 weeks due to the ability to fully relax her pelvic floor and contract.  Baseline:  Goal status: INITIAL   PLAN:  PT FREQUENCY:  1-2x/week  PT DURATION: 6 months  PLANNED INTERVENTIONS: 97110-Therapeutic exercises, 97530- Therapeutic activity, 97112- Neuromuscular re-education, 97535- Self Care, [id number]- Manual therapy, Patient/Family education, Dry Needling, Cryotherapy, Moist heat, and Biofeedback  PLAN FOR NEXT SESSION: manual work to pelvic floor, diaphragmatic breathing, abdominal massage for bowel movements, hip stretches   Channing Pereyra, PT 03/18/23 5:00 PM  PHYSICAL THERAPY DISCHARGE SUMMARY  Visits from Start of Care: 1  Current functional level related to goals / functional outcomes: See above.. Patient has not returned since initial evaluation on 03/18/23.    Remaining deficits: See above.    Education / Equipment: HEP   Patient agrees to discharge. Patient goals were not met. Patient is being discharged due to not returning since the last visit. Thank you for the referral.   Channing Pereyra, PT 09/18/23 10:12 AM

## 2023-03-18 ENCOUNTER — Other Ambulatory Visit: Payer: Self-pay

## 2023-03-18 ENCOUNTER — Encounter: Payer: 59 | Attending: Gastroenterology | Admitting: Physical Therapy

## 2023-03-18 ENCOUNTER — Encounter: Payer: Self-pay | Admitting: Physical Therapy

## 2023-03-18 DIAGNOSIS — R278 Other lack of coordination: Secondary | ICD-10-CM | POA: Insufficient documentation

## 2023-03-18 DIAGNOSIS — R252 Cramp and spasm: Secondary | ICD-10-CM | POA: Insufficient documentation

## 2023-03-18 NOTE — Patient Instructions (Signed)

## 2023-03-19 DIAGNOSIS — R102 Pelvic and perineal pain: Secondary | ICD-10-CM | POA: Diagnosis not present

## 2023-04-02 ENCOUNTER — Other Ambulatory Visit: Payer: Self-pay | Admitting: Obstetrics

## 2023-04-02 DIAGNOSIS — N6310 Unspecified lump in the right breast, unspecified quadrant: Secondary | ICD-10-CM

## 2023-04-02 DIAGNOSIS — N649 Disorder of breast, unspecified: Secondary | ICD-10-CM

## 2023-04-08 ENCOUNTER — Ambulatory Visit
Admission: RE | Admit: 2023-04-08 | Discharge: 2023-04-08 | Disposition: A | Discharge status: Home / Self Care | Source: Ambulatory Visit | Attending: Obstetrics | Admitting: Obstetrics

## 2023-04-08 DIAGNOSIS — N649 Disorder of breast, unspecified: Secondary | ICD-10-CM

## 2023-04-08 DIAGNOSIS — N6310 Unspecified lump in the right breast, unspecified quadrant: Secondary | ICD-10-CM

## 2023-04-08 DIAGNOSIS — N6311 Unspecified lump in the right breast, upper outer quadrant: Secondary | ICD-10-CM | POA: Diagnosis not present

## 2023-04-23 ENCOUNTER — Encounter: Payer: Self-pay | Admitting: Gastroenterology

## 2023-05-19 DIAGNOSIS — Z01419 Encounter for gynecological examination (general) (routine) without abnormal findings: Secondary | ICD-10-CM | POA: Diagnosis not present

## 2023-05-19 DIAGNOSIS — Z124 Encounter for screening for malignant neoplasm of cervix: Secondary | ICD-10-CM | POA: Diagnosis not present

## 2023-05-27 ENCOUNTER — Encounter: Payer: Self-pay | Admitting: Physical Therapy

## 2023-06-10 ENCOUNTER — Encounter: Payer: Self-pay | Admitting: Gastroenterology

## 2023-07-03 ENCOUNTER — Other Ambulatory Visit (INDEPENDENT_AMBULATORY_CARE_PROVIDER_SITE_OTHER)

## 2023-07-03 DIAGNOSIS — R194 Change in bowel habit: Secondary | ICD-10-CM | POA: Diagnosis not present

## 2023-07-03 DIAGNOSIS — M6289 Other specified disorders of muscle: Secondary | ICD-10-CM | POA: Diagnosis not present

## 2023-07-03 DIAGNOSIS — R748 Abnormal levels of other serum enzymes: Secondary | ICD-10-CM | POA: Diagnosis not present

## 2023-07-03 LAB — LIPASE: Lipase: 47 U/L (ref 11.0–59.0)

## 2023-07-03 LAB — AMYLASE: Amylase: 72 U/L (ref 27–131)

## 2023-07-04 LAB — ANA: Anti Nuclear Antibody (ANA): NEGATIVE

## 2023-07-05 LAB — IGG 4: IgG, Subclass 4: 85 mg/dL (ref 2–96)

## 2023-08-20 ENCOUNTER — Other Ambulatory Visit (HOSPITAL_COMMUNITY): Payer: Self-pay

## 2023-08-20 MED ORDER — PROPRANOLOL HCL 10 MG PO TABS
10.0000 mg | ORAL_TABLET | Freq: Every day | ORAL | 2 refills | Status: AC | PRN
  Filled 2023-08-20: qty 30, 30d supply, fill #0

## 2023-08-29 ENCOUNTER — Other Ambulatory Visit (HOSPITAL_COMMUNITY): Payer: Self-pay

## 2023-09-24 DIAGNOSIS — Z23 Encounter for immunization: Secondary | ICD-10-CM | POA: Diagnosis not present

## 2023-11-25 ENCOUNTER — Other Ambulatory Visit (HOSPITAL_COMMUNITY): Payer: Self-pay

## 2023-11-25 DIAGNOSIS — R1031 Right lower quadrant pain: Secondary | ICD-10-CM | POA: Diagnosis not present

## 2023-11-25 DIAGNOSIS — N83201 Unspecified ovarian cyst, right side: Secondary | ICD-10-CM | POA: Diagnosis not present

## 2023-11-25 MED ORDER — TRAMADOL HCL 50 MG PO TABS
50.0000 mg | ORAL_TABLET | Freq: Four times a day (QID) | ORAL | 0 refills | Status: AC
Start: 2023-11-25 — End: ?
  Filled 2023-11-25: qty 20, 5d supply, fill #0

## 2023-11-25 MED ORDER — NORETHINDRONE 0.35 MG PO TABS
1.0000 | ORAL_TABLET | Freq: Every day | ORAL | 4 refills | Status: AC
  Filled 2023-11-25: qty 84, 84d supply, fill #0
